# Patient Record
Sex: Female | Born: 1975 | Race: Black or African American | Hispanic: No | Marital: Married | Smoking: Never smoker
Health system: Southern US, Community
[De-identification: ages and names within clinical notes are randomized; demographics above are authoritative.]

## PROBLEM LIST (undated history)

## (undated) DIAGNOSIS — I10 Essential (primary) hypertension: Secondary | ICD-10-CM

## (undated) DIAGNOSIS — D649 Anemia, unspecified: Secondary | ICD-10-CM

## (undated) HISTORY — PX: OTHER SURGICAL HISTORY: SHX169

## (undated) HISTORY — DX: Anemia, unspecified: D64.9

## (undated) HISTORY — DX: Essential (primary) hypertension: I10

---

## 2008-06-24 ENCOUNTER — Ambulatory Visit: Payer: Self-pay | Admitting: Internal Medicine

## 2008-07-21 ENCOUNTER — Inpatient Hospital Stay: Payer: Self-pay | Admitting: Internal Medicine

## 2008-07-25 ENCOUNTER — Ambulatory Visit: Payer: Self-pay | Admitting: Internal Medicine

## 2008-07-28 ENCOUNTER — Ambulatory Visit: Payer: Self-pay | Admitting: Family Medicine

## 2008-08-08 ENCOUNTER — Ambulatory Visit: Payer: Self-pay | Admitting: Internal Medicine

## 2008-08-22 ENCOUNTER — Ambulatory Visit: Payer: Self-pay | Admitting: Internal Medicine

## 2008-09-22 ENCOUNTER — Ambulatory Visit: Payer: Self-pay | Admitting: Internal Medicine

## 2008-10-22 ENCOUNTER — Ambulatory Visit: Payer: Self-pay | Admitting: Internal Medicine

## 2008-11-22 ENCOUNTER — Ambulatory Visit: Payer: Self-pay | Admitting: Oncology

## 2008-12-08 ENCOUNTER — Ambulatory Visit: Payer: Self-pay | Admitting: Oncology

## 2008-12-22 ENCOUNTER — Ambulatory Visit: Payer: Self-pay | Admitting: Oncology

## 2009-01-22 ENCOUNTER — Ambulatory Visit: Payer: Self-pay | Admitting: Oncology

## 2009-02-22 ENCOUNTER — Ambulatory Visit: Payer: Self-pay | Admitting: Oncology

## 2009-03-02 ENCOUNTER — Ambulatory Visit: Payer: Self-pay | Admitting: Oncology

## 2009-03-24 ENCOUNTER — Ambulatory Visit: Payer: Self-pay | Admitting: Oncology

## 2009-05-25 ENCOUNTER — Ambulatory Visit: Payer: Self-pay | Admitting: Oncology

## 2009-06-24 ENCOUNTER — Ambulatory Visit: Payer: Self-pay | Admitting: Oncology

## 2009-07-25 ENCOUNTER — Ambulatory Visit: Payer: Self-pay | Admitting: Oncology

## 2009-08-17 ENCOUNTER — Ambulatory Visit: Payer: Self-pay | Admitting: Oncology

## 2009-08-22 ENCOUNTER — Ambulatory Visit: Payer: Self-pay | Admitting: Oncology

## 2009-09-22 ENCOUNTER — Ambulatory Visit: Payer: Self-pay | Admitting: Oncology

## 2009-11-17 ENCOUNTER — Ambulatory Visit: Payer: Self-pay | Admitting: Oncology

## 2009-11-22 ENCOUNTER — Ambulatory Visit: Payer: Self-pay | Admitting: Oncology

## 2010-02-22 ENCOUNTER — Ambulatory Visit: Payer: Self-pay | Admitting: Oncology

## 2010-02-23 ENCOUNTER — Ambulatory Visit: Payer: Self-pay | Admitting: Oncology

## 2010-03-24 ENCOUNTER — Ambulatory Visit: Payer: Self-pay | Admitting: Oncology

## 2010-05-25 ENCOUNTER — Ambulatory Visit: Payer: Self-pay | Admitting: Oncology

## 2010-06-24 ENCOUNTER — Ambulatory Visit: Payer: Self-pay | Admitting: Oncology

## 2010-08-24 ENCOUNTER — Ambulatory Visit: Payer: Self-pay | Admitting: Oncology

## 2010-09-23 ENCOUNTER — Ambulatory Visit: Payer: Self-pay | Admitting: Oncology

## 2010-12-07 ENCOUNTER — Ambulatory Visit: Payer: Self-pay | Admitting: Oncology

## 2010-12-23 ENCOUNTER — Ambulatory Visit: Payer: Self-pay | Admitting: Oncology

## 2011-03-08 ENCOUNTER — Ambulatory Visit: Payer: Self-pay | Admitting: Oncology

## 2011-03-25 ENCOUNTER — Ambulatory Visit: Payer: Self-pay | Admitting: Oncology

## 2011-06-12 ENCOUNTER — Ambulatory Visit: Payer: Self-pay | Admitting: Oncology

## 2011-06-25 ENCOUNTER — Ambulatory Visit: Payer: Self-pay | Admitting: Oncology

## 2011-11-07 ENCOUNTER — Ambulatory Visit: Payer: Self-pay | Admitting: Oncology

## 2011-11-11 LAB — CBC CANCER CENTER
Basophil #: 0.1 x10 3/mm (ref 0.0–0.1)
Basophil %: 1.2 %
Eosinophil #: 0.3 x10 3/mm (ref 0.0–0.7)
Eosinophil %: 3.6 %
HCT: 36.4 % (ref 35.0–47.0)
HGB: 11.5 g/dL — ABNORMAL LOW (ref 12.0–16.0)
Lymphocyte #: 1.9 x10 3/mm (ref 1.0–3.6)
Lymphocyte %: 25.4 %
MCH: 22.8 pg — ABNORMAL LOW (ref 26.0–34.0)
MCHC: 31.5 g/dL — ABNORMAL LOW (ref 32.0–36.0)
MCV: 72 fL — ABNORMAL LOW (ref 80–100)
Monocyte #: 0.5 x10 3/mm (ref 0.2–0.9)
Monocyte %: 7.1 %
Neutrophil #: 4.8 x10 3/mm (ref 1.4–6.5)
Neutrophil %: 62.7 %
Platelet: 243 x10 3/mm (ref 150–440)
RBC: 5.04 10*6/uL (ref 3.80–5.20)
RDW: 16 % — ABNORMAL HIGH (ref 11.5–14.5)
WBC: 7.6 x10 3/mm (ref 3.6–11.0)

## 2011-11-11 LAB — IRON AND TIBC
Iron Bind.Cap.(Total): 486 ug/dL — ABNORMAL HIGH (ref 250–450)
Iron Saturation: 8 %
Iron: 40 ug/dL — ABNORMAL LOW (ref 50–170)
Unbound Iron-Bind.Cap.: 446 ug/dL

## 2011-11-11 LAB — FERRITIN: Ferritin (ARMC): 9 ng/mL (ref 8–388)

## 2011-11-23 ENCOUNTER — Ambulatory Visit: Payer: Self-pay | Admitting: Oncology

## 2011-12-23 ENCOUNTER — Ambulatory Visit: Payer: Self-pay | Admitting: Oncology

## 2013-08-17 ENCOUNTER — Ambulatory Visit: Payer: Self-pay | Admitting: Oncology

## 2013-08-23 ENCOUNTER — Ambulatory Visit: Payer: Self-pay | Admitting: Hematology and Oncology

## 2013-08-23 LAB — CBC CANCER CENTER
Basophil #: 0.1 x10 3/mm (ref 0.0–0.1)
Basophil %: 1.4 %
Eosinophil #: 0.3 x10 3/mm (ref 0.0–0.7)
Eosinophil %: 3.7 %
HCT: 37.6 % (ref 35.0–47.0)
HGB: 11.6 g/dL — ABNORMAL LOW (ref 12.0–16.0)
Lymphocyte #: 1.7 x10 3/mm (ref 1.0–3.6)
Lymphocyte %: 21.3 %
MCH: 23.2 pg — ABNORMAL LOW (ref 26.0–34.0)
MCHC: 30.9 g/dL — ABNORMAL LOW (ref 32.0–36.0)
MCV: 75 fL — ABNORMAL LOW (ref 80–100)
Monocyte #: 0.6 x10 3/mm (ref 0.2–0.9)
Monocyte %: 7.4 %
Neutrophil #: 5.4 x10 3/mm (ref 1.4–6.5)
Neutrophil %: 66.2 %
Platelet: 288 x10 3/mm (ref 150–440)
RBC: 5 10*6/uL (ref 3.80–5.20)
RDW: 17.8 % — ABNORMAL HIGH (ref 11.5–14.5)
WBC: 8.1 x10 3/mm (ref 3.6–11.0)

## 2013-08-23 LAB — IRON AND TIBC
Iron Bind.Cap.(Total): 432 ug/dL (ref 250–450)
Iron Saturation: 30 %
Iron: 129 ug/dL (ref 50–170)
Unbound Iron-Bind.Cap.: 303 ug/dL

## 2013-08-23 LAB — FERRITIN: Ferritin (ARMC): 25 ng/mL (ref 8–388)

## 2013-09-22 ENCOUNTER — Ambulatory Visit: Payer: Self-pay | Admitting: Hematology and Oncology

## 2014-08-08 ENCOUNTER — Emergency Department: Payer: Self-pay | Admitting: Emergency Medicine

## 2015-02-08 ENCOUNTER — Telehealth: Payer: Self-pay | Admitting: Oncology

## 2015-02-08 NOTE — Telephone Encounter (Signed)
She said she feels like her levels are low and she was told last time she was here (March 2015) to call if she needed to come back. Pt would like to RTC for bloodwork and iron infusion. Please advise.

## 2015-02-08 NOTE — Telephone Encounter (Signed)
Patient may be scheduled for lab/md/feraheme when available.

## 2015-02-15 ENCOUNTER — Other Ambulatory Visit: Payer: Self-pay | Admitting: *Deleted

## 2015-02-15 DIAGNOSIS — D649 Anemia, unspecified: Secondary | ICD-10-CM

## 2015-02-17 ENCOUNTER — Inpatient Hospital Stay: Payer: BLUE CROSS/BLUE SHIELD

## 2015-02-17 ENCOUNTER — Inpatient Hospital Stay (HOSPITAL_BASED_OUTPATIENT_CLINIC_OR_DEPARTMENT_OTHER): Payer: BLUE CROSS/BLUE SHIELD | Admitting: Oncology

## 2015-02-17 ENCOUNTER — Encounter: Payer: Self-pay | Admitting: Oncology

## 2015-02-17 ENCOUNTER — Inpatient Hospital Stay: Payer: BLUE CROSS/BLUE SHIELD | Attending: Oncology

## 2015-02-17 VITALS — BP 133/85 | HR 111 | Temp 96.7°F | Resp 18 | Wt 308.4 lb

## 2015-02-17 DIAGNOSIS — Z79899 Other long term (current) drug therapy: Secondary | ICD-10-CM | POA: Diagnosis not present

## 2015-02-17 DIAGNOSIS — I10 Essential (primary) hypertension: Secondary | ICD-10-CM | POA: Diagnosis not present

## 2015-02-17 DIAGNOSIS — D509 Iron deficiency anemia, unspecified: Secondary | ICD-10-CM | POA: Insufficient documentation

## 2015-02-17 DIAGNOSIS — Z9884 Bariatric surgery status: Secondary | ICD-10-CM | POA: Insufficient documentation

## 2015-02-17 DIAGNOSIS — D649 Anemia, unspecified: Secondary | ICD-10-CM

## 2015-02-17 LAB — CBC WITH DIFFERENTIAL/PLATELET
Basophils Absolute: 0.1 10*3/uL (ref 0–0.1)
Basophils Relative: 1 %
Eosinophils Absolute: 0.4 10*3/uL (ref 0–0.7)
Eosinophils Relative: 5 %
HCT: 37.9 % (ref 35.0–47.0)
Hemoglobin: 11.9 g/dL — ABNORMAL LOW (ref 12.0–16.0)
Lymphocytes Relative: 25 %
Lymphs Abs: 2.1 10*3/uL (ref 1.0–3.6)
MCH: 23.8 pg — ABNORMAL LOW (ref 26.0–34.0)
MCHC: 31.5 g/dL — ABNORMAL LOW (ref 32.0–36.0)
MCV: 75.5 fL — ABNORMAL LOW (ref 80.0–100.0)
Monocytes Absolute: 0.4 10*3/uL (ref 0.2–0.9)
Monocytes Relative: 6 %
Neutro Abs: 5.2 10*3/uL (ref 1.4–6.5)
Neutrophils Relative %: 63 %
Platelets: 264 10*3/uL (ref 150–440)
RBC: 5.02 MIL/uL (ref 3.80–5.20)
RDW: 16.9 % — ABNORMAL HIGH (ref 11.5–14.5)
WBC: 8.1 10*3/uL (ref 3.6–11.0)

## 2015-02-17 LAB — IRON AND TIBC
Iron: 55 ug/dL (ref 28–170)
Saturation Ratios: 13 % (ref 10.4–31.8)
TIBC: 420 ug/dL (ref 250–450)
UIBC: 365 ug/dL

## 2015-02-17 LAB — FERRITIN: Ferritin: 33 ng/mL (ref 11–307)

## 2015-02-17 NOTE — Progress Notes (Signed)
[facility]  Telephone:(336) [phone] Fax:(336) [phone]  ID: Jeanette Flores OB: 01-18-1976  MR#: [id number]  HYQ#:[id number]  No care team member to display  CHIEF COMPLAINT:  Chief Complaint  Patient presents with  . Follow-up    anemia    INTERVAL HISTORY: Patient last seen in clinic in March of 2015.  Patient states she had noticed some increased weakness and fatigue over the past several weeks and wanted to be sure her iron stores and hemoglobin were not decreased.  She continues to tolerate oral iron without significant side effects.  She continues to be active and work full-time.  She denies any neurologic symptoms. She denies any fever, chills, or night sweats.  She denies any chest pain, shortness of breath, or cough.  She continues to have heavy menses.  Patient offers no further specific complaints today.   REVIEW OF SYSTEMS:   Review of Systems  Constitutional: Negative for malaise/fatigue.  Respiratory: Negative.   Cardiovascular: Negative.   Gastrointestinal: Negative.   Neurological: Positive for weakness.    As per HPI. Otherwise, a complete review of systems is negatve.  PAST MEDICAL HISTORY: Past Medical History  Diagnosis Date  . Hypertension     PAST SURGICAL HISTORY: Past Surgical History  Procedure Laterality Date  . Gastric bypass surgery     FAMILY HISTORY: Colon and breast cancer. No history of clotting or bleeding disorder.      ADVANCED DIRECTIVES:    HEALTH MAINTENANCE: Social History  Substance Use Topics  . Smoking status: Not on file  . Smokeless tobacco: Not on file  . Alcohol Use: Not on file     Colonoscopy:  PAP:  Bone density:  Lipid panel:  No Known Allergies  Current Outpatient Prescriptions  Medication Sig Dispense Refill  . ferrous sulfate 325 (65 FE) MG tablet Take 325 mg by mouth daily with breakfast.    . losartan-hydrochlorothiazide (HYZAAR) 100-12.5 MG per tablet   4   No current  facility-administered medications for this visit.    OBJECTIVE: Filed Vitals:   02/17/15 1143  BP: 133/85  Pulse: 111  Temp: 96.7 F (35.9 C)  Resp: 18     There is no height on file to calculate BMI.    ECOG FS:0 - Asymptomatic  General: Well-developed, well-nourished, no acute distress. Eyes: Pink conjunctiva, anicteric sclera. Lungs: Clear to auscultation bilaterally. Heart: Regular rate and rhythm. No rubs, murmurs, or gallops. Abdomen: Soft, nontender, nondistended. No organomegaly noted, normoactive bowel sounds. Musculoskeletal: No edema, cyanosis, or clubbing. Neuro: Alert, answering all questions appropriately. Cranial nerves grossly intact. Skin: No rashes or petechiae noted. Psych: Normal affect.  LAB RESULTS:  No results found for: NA, K, CL, CO2, GLUCOSE, BUN, CREATININE, CALCIUM, PROT, ALBUMIN, AST, ALT, ALKPHOS, BILITOT, GFRNONAA, GFRAA  Lab Results  Component Value Date   WBC 8.1 02/17/2015   NEUTROABS 5.2 02/17/2015   HGB 11.9* 02/17/2015   HCT 37.9 02/17/2015   MCV 75.5* 02/17/2015   PLT 264 02/17/2015     STUDIES: No results found.  ASSESSMENT: Iron deficiency anemia.  PLAN:    1.  Iron deficiency anemia: Patient's hemoglobin is essentially within normal limits, iron stores are pending from today. Patient does not require IV iron today.  She last received IV iron in May of 2013.  Continue oral iron supplementation.  No followup has been scheduled, but patient has been instructed if she begins to feel increasing weakness and fatigue, she can return to clinic for further  evaluation and to recheck her laboratory work.    Patient expressed understanding and was in agreement with this plan. She also understands that She can call clinic at any time with any questions, concerns, or complaints.     Jeralyn Ruths, MD   02/17/2015 4:42 PM

## 2015-08-24 ENCOUNTER — Encounter (INDEPENDENT_AMBULATORY_CARE_PROVIDER_SITE_OTHER): Payer: BLUE CROSS/BLUE SHIELD | Admitting: Ophthalmology

## 2015-08-24 DIAGNOSIS — H43813 Vitreous degeneration, bilateral: Secondary | ICD-10-CM | POA: Diagnosis not present

## 2015-08-24 DIAGNOSIS — H2511 Age-related nuclear cataract, right eye: Secondary | ICD-10-CM | POA: Diagnosis not present

## 2015-08-24 DIAGNOSIS — I10 Essential (primary) hypertension: Secondary | ICD-10-CM

## 2015-08-24 DIAGNOSIS — H35033 Hypertensive retinopathy, bilateral: Secondary | ICD-10-CM | POA: Diagnosis not present

## 2015-08-24 DIAGNOSIS — H3540 Unspecified peripheral retinal degeneration: Secondary | ICD-10-CM

## 2015-09-21 ENCOUNTER — Encounter: Payer: Self-pay | Admitting: Family Medicine

## 2015-09-21 ENCOUNTER — Ambulatory Visit (INDEPENDENT_AMBULATORY_CARE_PROVIDER_SITE_OTHER): Payer: BLUE CROSS/BLUE SHIELD | Admitting: Family Medicine

## 2015-09-21 VITALS — BP 122/84 | HR 80 | Ht 66.0 in | Wt 309.0 lb

## 2015-09-21 DIAGNOSIS — D509 Iron deficiency anemia, unspecified: Secondary | ICD-10-CM

## 2015-09-21 DIAGNOSIS — E119 Type 2 diabetes mellitus without complications: Secondary | ICD-10-CM | POA: Diagnosis not present

## 2015-09-21 MED ORDER — METFORMIN HCL ER 500 MG PO TB24: 500.0000 mg | ORAL_TABLET | Freq: Every day | ORAL | Status: DC

## 2015-09-21 MED ORDER — GLIPIZIDE 5 MG PO TABS: 5.0000 mg | ORAL_TABLET | Freq: Two times a day (BID) | ORAL | Status: DC

## 2015-09-21 NOTE — Progress Notes (Signed)
Name: Jeanette Flores   MRN: [id number]    DOB: 1975/08/14   Date:09/21/2015       Progress Note  Subjective  Chief Complaint  Chief Complaint  Patient presents with  . Diabetes    "fasting Blood sugar yesterday was 244"    Diabetes She presents for her follow-up diabetic visit. She has type 2 diabetes mellitus. Her disease course has been stable. There are no hypoglycemic associated symptoms. Pertinent negatives for hypoglycemia include no confusion, dizziness, headaches, hunger, mood changes, nervousness/anxiousness, pallor, seizures, sleepiness, speech difficulty, sweats or tremors. Associated symptoms include fatigue. Pertinent negatives for diabetes include no blurred vision, no chest pain, no foot paresthesias, no foot ulcerations, no polydipsia, no polyphagia, no polyuria, no visual change, no weakness and no weight loss. There are no hypoglycemic complications. Symptoms are stable. There are no diabetic complications. There are no known risk factors for coronary artery disease. When asked about current treatments, none were reported. She is compliant with treatment most of the time. Her weight is increasing steadily. She is following a generally healthy diet. She has not had a previous visit with a dietitian. Her breakfast blood glucose is taken between 8-9 am. Her breakfast blood glucose range is generally 180-200 mg/dl. An ACE inhibitor/angiotensin II receptor blocker is being taken. She does not see a podiatrist.Eye exam is not current.  Anemia Presents for follow-up visit. There has been no abdominal pain, anorexia, bruising/bleeding easily, confusion, fever, leg swelling, light-headedness, malaise/fatigue, pallor, palpitations, paresthesias or weight loss. Signs of blood loss that are not present include hematemesis, hematochezia, melena, menorrhagia and vaginal bleeding. Past treatments include oral iron supplements. There are no compliance problems.     No problem-specific assessment &  plan notes found for this encounter.   Past Medical History  Diagnosis Date  . Hypertension   . Anemia     Past Surgical History  Procedure Laterality Date  . Gastric bypass surgery      Family History  Problem Relation Age of Onset  . Diabetes Father   . Diabetes Paternal Grandmother     Social History   Social History  . Marital Status: Single    Spouse Name: N/A  . Number of Children: N/A  . Years of Education: N/A   Occupational History  . Not on file.   Social History Main Topics  . Smoking status: Never Smoker   . Smokeless tobacco: Not on file  . Alcohol Use: No  . Drug Use: No  . Sexual Activity: Not on file   Other Topics Concern  . Not on file   Social History Narrative    No Known Allergies   Review of Systems  Constitutional: Positive for fatigue. Negative for fever, chills, weight loss and malaise/fatigue.  HENT: Negative for ear discharge, ear pain and sore throat.   Eyes: Negative for blurred vision.  Respiratory: Negative for cough, sputum production, shortness of breath and wheezing.   Cardiovascular: Negative for chest pain, palpitations and leg swelling.  Gastrointestinal: Negative for heartburn, nausea, abdominal pain, diarrhea, constipation, blood in stool, melena, hematochezia, anorexia and hematemesis.  Genitourinary: Negative for dysuria, urgency, frequency, hematuria, vaginal bleeding and menorrhagia.  Musculoskeletal: Negative for myalgias, back pain, joint pain and neck pain.  Skin: Negative for pallor and rash.  Neurological: Negative for dizziness, tingling, tremors, sensory change, focal weakness, seizures, speech difficulty, weakness, light-headedness, headaches and paresthesias.  Endo/Heme/Allergies: Negative for environmental allergies, polydipsia and polyphagia. Does not bruise/bleed easily.  Psychiatric/Behavioral: Negative for depression,  suicidal ideas and confusion. The patient is not nervous/anxious and does not have  insomnia.      Objective  Filed Vitals:   09/21/15 0807  BP: 122/84  Pulse: 80  Height:  (1.676 m)  Weight: 309 lb (140.161 kg)    Physical Exam  Constitutional: She is well-developed, well-nourished, and in no distress. No distress.  HENT:  Head: Normocephalic and atraumatic.  Right Ear: External ear normal.  Left Ear: External ear normal.  Nose: Nose normal.  Mouth/Throat: Oropharynx is clear and moist.  Eyes: Conjunctivae and EOM are normal. Pupils are equal, round, and reactive to light. Right eye exhibits no discharge. Left eye exhibits no discharge.  Neck: Normal range of motion. Neck supple. No JVD present. No thyromegaly present.  Cardiovascular: Normal rate, regular rhythm and intact distal pulses.  Exam reveals no gallop and no friction rub.   Murmur heard. Pulmonary/Chest: Effort normal and breath sounds normal.  Abdominal: Soft. Bowel sounds are normal. She exhibits no mass. There is no tenderness. There is no guarding.  Musculoskeletal: Normal range of motion. She exhibits no edema.  Lymphadenopathy:    She has no cervical adenopathy.  Neurological: She is alert.  Skin: Skin is warm and dry. She is not diaphoretic.  Psychiatric: Mood and affect normal.  Nursing note and vitals reviewed.     Assessment & Plan  Problem List Items Addressed This Visit    None    Visit Diagnoses    Type 2 diabetes mellitus without complication, without long-term current use of insulin (HCC)    -  Primary    Relevant Medications    metFORMIN (GLUCOPHAGE XR) 500 MG 24 hr tablet    glipiZIDE (GLUCOTROL) 5 MG tablet    Other Relevant Orders    Hemoglobin A1c    Renal Function Panel    Iron deficiency anemia        Relevant Orders    CBC         Dr. [facility]  09/21/2015

## 2015-09-22 LAB — CBC
Hematocrit: 39 % (ref 34.0–46.6)
Hemoglobin: 12.4 g/dL (ref 11.1–15.9)
MCH: 24.8 pg — ABNORMAL LOW (ref 26.6–33.0)
MCHC: 31.8 g/dL (ref 31.5–35.7)
MCV: 78 fL — ABNORMAL LOW (ref 79–97)
Platelets: 305 10*3/uL (ref 150–379)
RBC: 5 x10E6/uL (ref 3.77–5.28)
RDW: 15.2 % (ref 12.3–15.4)
WBC: 8.3 10*3/uL (ref 3.4–10.8)

## 2015-09-22 LAB — RENAL FUNCTION PANEL
Albumin: 3.9 g/dL (ref 3.5–5.5)
BUN/Creatinine Ratio: 17 (ref 8–20)
BUN: 11 mg/dL (ref 6–20)
CO2: 20 mmol/L (ref 18–29)
Calcium: 9.1 mg/dL (ref 8.7–10.2)
Chloride: 93 mmol/L — ABNORMAL LOW (ref 96–106)
Creatinine, Ser: 0.63 mg/dL (ref 0.57–1.00)
GFR calc Af Amer: 133 mL/min/{1.73_m2} (ref >59)
GFR calc non Af Amer: 115 mL/min/{1.73_m2} (ref >59)
Glucose: 270 mg/dL — ABNORMAL HIGH (ref 65–99)
Phosphorus: 3.3 mg/dL (ref 2.5–4.5)
Potassium: 4.9 mmol/L (ref 3.5–5.2)
Sodium: 134 mmol/L (ref 134–144)

## 2015-09-22 LAB — HEMOGLOBIN A1C
Est. average glucose Bld gHb Est-mCnc: 252 mg/dL
Hgb A1c MFr Bld: 10.4 % — ABNORMAL HIGH (ref 4.8–5.6)

## 2015-09-28 ENCOUNTER — Other Ambulatory Visit: Payer: Self-pay

## 2015-10-06 ENCOUNTER — Other Ambulatory Visit: Payer: Self-pay | Admitting: Family Medicine

## 2015-11-02 ENCOUNTER — Ambulatory Visit (INDEPENDENT_AMBULATORY_CARE_PROVIDER_SITE_OTHER): Payer: BLUE CROSS/BLUE SHIELD | Admitting: Family Medicine

## 2015-11-02 ENCOUNTER — Encounter: Payer: Self-pay | Admitting: Family Medicine

## 2015-11-02 VITALS — BP 120/80 | HR 76 | Ht 66.0 in | Wt 307.0 lb

## 2015-11-02 DIAGNOSIS — I10 Essential (primary) hypertension: Secondary | ICD-10-CM

## 2015-11-02 DIAGNOSIS — E663 Overweight: Secondary | ICD-10-CM

## 2015-11-02 DIAGNOSIS — E785 Hyperlipidemia, unspecified: Secondary | ICD-10-CM | POA: Diagnosis not present

## 2015-11-02 DIAGNOSIS — E119 Type 2 diabetes mellitus without complications: Secondary | ICD-10-CM

## 2015-11-02 MED ORDER — GLIPIZIDE 5 MG PO TABS: 5.0000 mg | ORAL_TABLET | Freq: Two times a day (BID) | ORAL | Status: DC

## 2015-11-02 MED ORDER — METFORMIN HCL ER 500 MG PO TB24
500.0000 mg | ORAL_TABLET | Freq: Every day | ORAL | Status: DC
Start: 2015-11-02 — End: 2016-04-18

## 2015-11-02 MED ORDER — LOSARTAN POTASSIUM-HCTZ 100-12.5 MG PO TABS: 1.0000 | ORAL_TABLET | Freq: Every day | ORAL | Status: DC

## 2015-11-02 NOTE — Progress Notes (Signed)
Name: Jeanette Flores   MRN: [id number]    DOB: 15-Feb-1976   Date:11/02/2015       Progress Note  Subjective  Chief Complaint  Chief Complaint  Patient presents with  . Diabetes    Diabetes She presents for her follow-up diabetic visit. Her disease course has been stable. There are no hypoglycemic associated symptoms. Pertinent negatives for hypoglycemia include no dizziness, headaches, nervousness/anxiousness or sweats. Pertinent negatives for diabetes include no blurred vision, no chest pain, no fatigue, no foot paresthesias, no foot ulcerations, no polydipsia, no polyphagia, no polyuria, no visual change, no weakness and no weight loss. There are no hypoglycemic complications. Symptoms are stable. There are no diabetic complications. Pertinent negatives for diabetic complications include no CVA, PVD or retinopathy. Current diabetic treatment includes diet and oral agent (dual therapy). She is compliant with treatment all of the time. Her weight is stable. She is following a generally healthy diet. She has not had a previous visit with a dietitian. She participates in exercise every other day. Her breakfast blood glucose is taken between 8-9 am. Her breakfast blood glucose range is generally 130-140 mg/dl. An ACE inhibitor/angiotensin II receptor blocker is being taken. She does not see a podiatrist.Eye exam is current.  Hypertension This is a chronic problem. The current episode started more than 1 year ago. The problem has been gradually improving since onset. The problem is controlled. Pertinent negatives include no anxiety, blurred vision, chest pain, headaches, malaise/fatigue, neck pain, orthopnea, palpitations, peripheral edema, PND, shortness of breath or sweats. There are no associated agents to hypertension. Risk factors for coronary artery disease include diabetes mellitus, dyslipidemia and obesity. Past treatments include angiotensin blockers and diuretics. The current treatment provides no  improvement. There are no compliance problems.  There is no history of angina, kidney disease, CAD/MI, CVA, heart failure, left ventricular hypertrophy, PVD, renovascular disease or retinopathy. There is no history of chronic renal disease or a hypertension causing med.    No problem-specific assessment & plan notes found for this encounter.   Past Medical History  Diagnosis Date  . Hypertension   . Anemia     Past Surgical History  Procedure Laterality Date  . Gastric bypass surgery      Family History  Problem Relation Age of Onset  . Diabetes Father   . Diabetes Paternal Grandmother     Social History   Social History  . Marital Status: Single    Spouse Name: N/A  . Number of Children: N/A  . Years of Education: N/A   Occupational History  . Not on file.   Social History Main Topics  . Smoking status: Never Smoker   . Smokeless tobacco: Not on file  . Alcohol Use: No  . Drug Use: No  . Sexual Activity: Not on file   Other Topics Concern  . Not on file   Social History Narrative    No Known Allergies   Review of Systems  Constitutional: Negative for fever, chills, weight loss, malaise/fatigue and fatigue.  HENT: Negative for ear discharge, ear pain and sore throat.   Eyes: Negative for blurred vision.  Respiratory: Negative for cough, sputum production, shortness of breath and wheezing.   Cardiovascular: Negative for chest pain, palpitations, orthopnea, leg swelling and PND.  Gastrointestinal: Negative for heartburn, nausea, abdominal pain, diarrhea, constipation, blood in stool and melena.  Genitourinary: Negative for dysuria, urgency, frequency and hematuria.  Musculoskeletal: Negative for myalgias, back pain, joint pain and neck pain.  Skin:  Negative for rash.  Neurological: Negative for dizziness, tingling, sensory change, focal weakness, weakness and headaches.  Endo/Heme/Allergies: Negative for environmental allergies, polydipsia and polyphagia.  Does not bruise/bleed easily.  Psychiatric/Behavioral: Negative for depression and suicidal ideas. The patient is not nervous/anxious and does not have insomnia.      Objective  Filed Vitals:   11/02/15 0810  BP: 120/80  Pulse: 76  Height:  (1.676 m)  Weight: 307 lb (139.254 kg)    Physical Exam  Constitutional: She is well-developed, well-nourished, and in no distress. No distress.  HENT:  Head: Normocephalic and atraumatic.  Right Ear: Tympanic membrane, external ear and ear canal normal. No decreased hearing is noted.  Left Ear: Tympanic membrane, external ear and ear canal normal. No decreased hearing is noted.  Nose: Nose normal.  Mouth/Throat: Oropharynx is clear and moist.  Eyes: Conjunctivae and EOM are normal. Pupils are equal, round, and reactive to light. Right eye exhibits no discharge. Left eye exhibits no discharge.  Neck: Normal range of motion. Neck supple. No JVD present. No thyromegaly present.  Cardiovascular: Normal rate, regular rhythm, normal heart sounds and intact distal pulses.  Exam reveals no gallop and no friction rub.   No murmur heard. Pulmonary/Chest: Effort normal and breath sounds normal.  Abdominal: Soft. Bowel sounds are normal. She exhibits no mass. There is no tenderness. There is no guarding.  Musculoskeletal: Normal range of motion. She exhibits no edema.  Lymphadenopathy:    She has no cervical adenopathy.  Neurological: She is alert. She has normal reflexes.  monofilament  Skin: Skin is warm and dry. She is not diaphoretic.  Psychiatric: Mood and affect normal.  Nursing note and vitals reviewed.     Assessment & Plan  Problem List Items Addressed This Visit      Cardiovascular and Mediastinum   Essential hypertension - Primary   Relevant Medications   aspirin 81 MG tablet   losartan-hydrochlorothiazide (HYZAAR) 100-12.5 MG tablet   Other Relevant Orders   Renal Function Panel     Endocrine   Type 2 diabetes mellitus  without complication, without long-term current use of insulin (HCC)   Relevant Medications   aspirin 81 MG tablet   glipiZIDE (GLUCOTROL) 5 MG tablet   metFORMIN (GLUCOPHAGE XR) 500 MG 24 hr tablet   losartan-hydrochlorothiazide (HYZAAR) 100-12.5 MG tablet   Other Relevant Orders   Hemoglobin A1c   Microalbumin / creatinine urine ratio     Other   Hyperlipidemia   Relevant Medications   aspirin 81 MG tablet   losartan-hydrochlorothiazide (HYZAAR) 100-12.5 MG tablet   Other Relevant Orders   Lipid Profile   Overweight        Dr. [facility]  11/02/2015

## 2015-11-03 LAB — RENAL FUNCTION PANEL
Albumin: 4.2 g/dL (ref 3.5–5.5)
BUN/Creatinine Ratio: 20 (ref 9–23)
BUN: 15 mg/dL (ref 6–20)
CO2: 21 mmol/L (ref 18–29)
Calcium: 9.4 mg/dL (ref 8.7–10.2)
Chloride: 95 mmol/L — ABNORMAL LOW (ref 96–106)
Creatinine, Ser: 0.74 mg/dL (ref 0.57–1.00)
GFR calc Af Amer: 120 mL/min/{1.73_m2} (ref >59)
GFR calc non Af Amer: 104 mL/min/{1.73_m2} (ref >59)
Glucose: 126 mg/dL — ABNORMAL HIGH (ref 65–99)
Phosphorus: 4.5 mg/dL (ref 2.5–4.5)
Potassium: 4.8 mmol/L (ref 3.5–5.2)
Sodium: 137 mmol/L (ref 134–144)

## 2015-11-03 LAB — MICROALBUMIN / CREATININE URINE RATIO
Creatinine, Urine: 254.2 mg/dL
MICROALB/CREAT RATIO: 14.9 mg/g creat (ref 0.0–30.0)
Microalbumin, Urine: 37.9 ug/mL

## 2015-11-03 LAB — LIPID PANEL
Chol/HDL Ratio: 5.4 ratio units — ABNORMAL HIGH (ref 0.0–4.4)
Cholesterol, Total: 201 mg/dL — ABNORMAL HIGH (ref 100–199)
HDL: 37 mg/dL — ABNORMAL LOW (ref >39)
LDL Calculated: 147 mg/dL — ABNORMAL HIGH (ref 0–99)
Triglycerides: 87 mg/dL (ref 0–149)
VLDL Cholesterol Cal: 17 mg/dL (ref 5–40)

## 2015-11-03 LAB — HEMOGLOBIN A1C
Est. average glucose Bld gHb Est-mCnc: 206 mg/dL
Hgb A1c MFr Bld: 8.8 % — ABNORMAL HIGH (ref 4.8–5.6)

## 2015-11-03 MED ORDER — GLIPIZIDE 10 MG PO TABS: 10.0000 mg | ORAL_TABLET | Freq: Two times a day (BID) | ORAL | Status: DC

## 2015-11-03 NOTE — Addendum Note (Signed)
Addended by: Everitt AmberLYNCH, Koula Venier L on: 11/03/2015 04:32 PM   Modules accepted: Orders, Medications

## 2015-11-07 ENCOUNTER — Telehealth: Payer: Self-pay | Admitting: Oncology

## 2015-11-07 NOTE — Telephone Encounter (Signed)
Patient lvm in [place]. She is feeling really weak and tired and requests lab/md visit. Please advise. Thanks.

## 2015-11-07 NOTE — Telephone Encounter (Signed)
Pt last seen in august 2016.  She can have next available appt. Add lab encounter and feraheme to visit.

## 2015-11-08 ENCOUNTER — Other Ambulatory Visit: Payer: Self-pay | Admitting: Oncology

## 2015-11-08 DIAGNOSIS — D509 Iron deficiency anemia, unspecified: Secondary | ICD-10-CM

## 2015-11-08 DIAGNOSIS — E119 Type 2 diabetes mellitus without complications: Secondary | ICD-10-CM

## 2015-11-09 ENCOUNTER — Other Ambulatory Visit: Payer: Self-pay | Admitting: *Deleted

## 2015-11-09 DIAGNOSIS — D509 Iron deficiency anemia, unspecified: Secondary | ICD-10-CM

## 2015-11-10 ENCOUNTER — Ambulatory Visit (HOSPITAL_BASED_OUTPATIENT_CLINIC_OR_DEPARTMENT_OTHER): Payer: BLUE CROSS/BLUE SHIELD | Admitting: Oncology

## 2015-11-10 ENCOUNTER — Inpatient Hospital Stay: Payer: BLUE CROSS/BLUE SHIELD

## 2015-11-10 ENCOUNTER — Inpatient Hospital Stay: Payer: BLUE CROSS/BLUE SHIELD | Attending: Oncology

## 2015-11-10 VITALS — BP 101/70 | HR 87 | Temp 96.7°F | Resp 18

## 2015-11-10 VITALS — BP 133/84 | HR 105 | Temp 96.1°F

## 2015-11-10 DIAGNOSIS — E1165 Type 2 diabetes mellitus with hyperglycemia: Secondary | ICD-10-CM | POA: Insufficient documentation

## 2015-11-10 DIAGNOSIS — R5383 Other fatigue: Secondary | ICD-10-CM | POA: Diagnosis not present

## 2015-11-10 DIAGNOSIS — Z79899 Other long term (current) drug therapy: Secondary | ICD-10-CM | POA: Diagnosis not present

## 2015-11-10 DIAGNOSIS — Z9884 Bariatric surgery status: Secondary | ICD-10-CM | POA: Insufficient documentation

## 2015-11-10 DIAGNOSIS — I10 Essential (primary) hypertension: Secondary | ICD-10-CM | POA: Diagnosis not present

## 2015-11-10 DIAGNOSIS — R531 Weakness: Secondary | ICD-10-CM | POA: Insufficient documentation

## 2015-11-10 DIAGNOSIS — Z7984 Long term (current) use of oral hypoglycemic drugs: Secondary | ICD-10-CM | POA: Insufficient documentation

## 2015-11-10 DIAGNOSIS — D509 Iron deficiency anemia, unspecified: Secondary | ICD-10-CM

## 2015-11-10 DIAGNOSIS — Z7982 Long term (current) use of aspirin: Secondary | ICD-10-CM

## 2015-11-10 LAB — CBC WITH DIFFERENTIAL/PLATELET
Basophils Absolute: 0.1 10*3/uL (ref 0–0.1)
Basophils Relative: 1 %
Eosinophils Absolute: 0.4 10*3/uL (ref 0–0.7)
Eosinophils Relative: 4 %
HCT: 38.1 % (ref 35.0–47.0)
Hemoglobin: 11.9 g/dL — ABNORMAL LOW (ref 12.0–16.0)
Lymphocytes Relative: 24 %
Lymphs Abs: 2.4 10*3/uL (ref 1.0–3.6)
MCH: 23.9 pg — ABNORMAL LOW (ref 26.0–34.0)
MCHC: 31.4 g/dL — ABNORMAL LOW (ref 32.0–36.0)
MCV: 76.1 fL — ABNORMAL LOW (ref 80.0–100.0)
Monocytes Absolute: 0.3 10*3/uL (ref 0.2–0.9)
Monocytes Relative: 3 %
Neutro Abs: 6.9 10*3/uL — ABNORMAL HIGH (ref 1.4–6.5)
Neutrophils Relative %: 68 %
Platelets: 307 10*3/uL (ref 150–440)
RBC: 5 MIL/uL (ref 3.80–5.20)
RDW: 14.9 % — ABNORMAL HIGH (ref 11.5–14.5)
WBC: 10.1 10*3/uL (ref 3.6–11.0)

## 2015-11-10 LAB — IRON AND TIBC
Iron: 33 ug/dL (ref 28–170)
Saturation Ratios: 8 % — ABNORMAL LOW (ref 10.4–31.8)
TIBC: 420 ug/dL (ref 250–450)
UIBC: 387 ug/dL

## 2015-11-10 LAB — FERRITIN: Ferritin: 36 ng/mL (ref 11–307)

## 2015-11-10 MED ORDER — SODIUM CHLORIDE 0.9 % IV SOLN
510.0000 mg | Freq: Once | INTRAVENOUS | Status: AC
  Administered 2015-11-10: 510 mg via INTRAVENOUS
  Filled 2015-11-10: qty 17

## 2015-11-10 MED ORDER — SODIUM CHLORIDE 0.9 % IV SOLN
Freq: Once | INTRAVENOUS | Status: AC
  Administered 2015-11-10: 15:00:00 via INTRAVENOUS
  Filled 2015-11-10: qty 1000

## 2015-11-10 NOTE — Patient Instructions (Signed)

## 2015-11-10 NOTE — Progress Notes (Signed)
Patient has noticed an increase in fatigue in the last 2 weeks.

## 2015-11-15 NOTE — Progress Notes (Signed)
[facility]  Telephone:(336) [phone] Fax:(336) [phone]  ID: Jeanette Flores OB: 29-Sep-1975  MR#: [id number]  PPI#:[id number]#:[id number]  Patient Care Team: Duanne Limerickeanna C Jones, MD as PCP - General (Family Medicine)  CHIEF COMPLAINT:  Chief Complaint  Patient presents with  . Anemia    INTERVAL HISTORY: Patient last seen in clinic in August 2016. Patient states she has noticed increased weakness and fatigue over the past several weeks. She is no longer taking oral iron supplementation. She continues to be active and work full-time.  She denies any neurologic symptoms. She denies any fever, chills, or night sweats.  She denies any chest pain, shortness of breath, or cough.  She continues to have heavy menses.  Patient offers no further specific complaints today.   REVIEW OF SYSTEMS:   Review of Systems  Constitutional: Negative for fever, weight loss and malaise/fatigue.  Respiratory: Negative.  Negative for shortness of breath.   Cardiovascular: Negative.  Negative for chest pain.  Gastrointestinal: Negative.  Negative for blood in stool and melena.  Musculoskeletal: Negative.   Neurological: Negative.   Psychiatric/Behavioral: Negative.     As per HPI. Otherwise, a complete review of systems is negatve.  PAST MEDICAL HISTORY: Past Medical History  Diagnosis Date  . Hypertension   . Anemia     PAST SURGICAL HISTORY: Past Surgical History  Procedure Laterality Date  . Gastric bypass surgery     FAMILY HISTORY: Colon and breast cancer. No history of clotting or bleeding disorder.      ADVANCED DIRECTIVES:    HEALTH MAINTENANCE: Social History  Substance Use Topics  . Smoking status: Never Smoker   . Smokeless tobacco: Not on file  . Alcohol Use: No     Colonoscopy:  PAP:  Bone density:  Lipid panel:  No Known Allergies  Current Outpatient Prescriptions  Medication Sig Dispense Refill  . aspirin 81 MG tablet Take 81 mg by mouth daily.    Marland Kitchen. glipiZIDE  (GLUCOTROL) 10 MG tablet Take 1 tablet (10 mg total) by mouth 2 (two) times daily before a meal. 60 tablet 1  . losartan-hydrochlorothiazide (HYZAAR) 100-12.5 MG tablet Take 1 tablet by mouth daily. 30 tablet 6  . metFORMIN (GLUCOPHAGE XR) 500 MG 24 hr tablet Take 1 tablet (500 mg total) by mouth daily with breakfast. 30 tablet 6   No current facility-administered medications for this visit.    OBJECTIVE: Filed Vitals:   11/10/15 1412  BP: 133/84  Pulse: 105  Temp: 96.1 F (35.6 C)     There is no weight on file to calculate BMI.    ECOG FS:0 - Asymptomatic  General: Well-developed, well-nourished, no acute distress. Eyes: Pink conjunctiva, anicteric sclera. Lungs: Clear to auscultation bilaterally. Heart: Regular rate and rhythm. No rubs, murmurs, or gallops. Abdomen: Soft, nontender, nondistended. No organomegaly noted, normoactive bowel sounds. Musculoskeletal: No edema, cyanosis, or clubbing. Neuro: Alert, answering all questions appropriately. Cranial nerves grossly intact. Skin: No rashes or petechiae noted. Psych: Normal affect.  LAB RESULTS:  Lab Results  Component Value Date   NA 137 11/02/2015   K 4.8 11/02/2015   CL 95* 11/02/2015   CO2 21 11/02/2015   GLUCOSE 126* 11/02/2015   BUN 15 11/02/2015   CREATININE 0.74 11/02/2015   CALCIUM 9.4 11/02/2015   ALBUMIN 4.2 11/02/2015   GFRNONAA 104 11/02/2015   GFRAA 120 11/02/2015    Lab Results  Component Value Date   WBC 10.1 11/10/2015   NEUTROABS 6.9* 11/10/2015   HGB  11.9* 11/10/2015   HCT 38.1 11/10/2015   MCV 76.1* 11/10/2015   PLT 307 11/10/2015   Lab Results  Component Value Date   IRON 33 11/10/2015   TIBC 420 11/10/2015   IRONPCTSAT 8* 11/10/2015    Lab Results  Component Value Date   FERRITIN 36 11/10/2015     STUDIES: No results found.  ASSESSMENT: Iron deficiency anemia.  PLAN:    1.  Iron deficiency anemia: Patient's hemoglobin is essentially within normal limits, But her iron  stores are decreased and she is mildly symptomatic. Proceed with 510 mg IV Feraheme today. Have recommended patient restart oral iron supplementation as well. Return to clinic in 6 months with repeat laboratory work and further evaluation.  2. Hyperglycemia: Continue current diabetic medications as prescribed.  Patient expressed understanding and was in agreement with this plan. She also understands that She can call clinic at any time with any questions, concerns, or complaints.     Jeralyn Ruths, MD   11/15/2015 9:09 AM

## 2015-12-09 ENCOUNTER — Other Ambulatory Visit: Payer: Self-pay | Admitting: Nurse Practitioner

## 2015-12-25 ENCOUNTER — Other Ambulatory Visit: Payer: Self-pay | Admitting: Nurse Practitioner

## 2016-01-18 ENCOUNTER — Other Ambulatory Visit: Payer: Self-pay | Admitting: Family Medicine

## 2016-04-17 ENCOUNTER — Ambulatory Visit: Payer: BLUE CROSS/BLUE SHIELD | Admitting: Family Medicine

## 2016-04-18 ENCOUNTER — Encounter: Payer: Self-pay | Admitting: Family Medicine

## 2016-04-18 ENCOUNTER — Ambulatory Visit (INDEPENDENT_AMBULATORY_CARE_PROVIDER_SITE_OTHER): Payer: BLUE CROSS/BLUE SHIELD | Admitting: Family Medicine

## 2016-04-18 VITALS — BP 110/70 | HR 80 | Ht 66.0 in | Wt 309.0 lb

## 2016-04-18 DIAGNOSIS — E782 Mixed hyperlipidemia: Secondary | ICD-10-CM

## 2016-04-18 DIAGNOSIS — I10 Essential (primary) hypertension: Secondary | ICD-10-CM | POA: Diagnosis not present

## 2016-04-18 DIAGNOSIS — E119 Type 2 diabetes mellitus without complications: Secondary | ICD-10-CM

## 2016-04-18 DIAGNOSIS — Z23 Encounter for immunization: Secondary | ICD-10-CM | POA: Diagnosis not present

## 2016-04-18 MED ORDER — GLIPIZIDE 10 MG PO TABS: ORAL_TABLET | ORAL | 1 refills | Status: DC

## 2016-04-18 MED ORDER — LOSARTAN POTASSIUM-HCTZ 100-12.5 MG PO TABS: 1.0000 | ORAL_TABLET | Freq: Every day | ORAL | 1 refills | Status: DC

## 2016-04-18 MED ORDER — ASPIRIN 81 MG PO TABS: 81.0000 mg | ORAL_TABLET | Freq: Every day | ORAL | 11 refills | Status: AC

## 2016-04-18 MED ORDER — METFORMIN HCL ER 500 MG PO TB24: 500.0000 mg | ORAL_TABLET | Freq: Every day | ORAL | 1 refills | Status: AC

## 2016-04-18 NOTE — Progress Notes (Signed)
Name: Jeanette Flores   MRN: [id number]    DOB: 1976/06/08   Date:04/18/2016       Progress Note  Subjective  Chief Complaint  Chief Complaint  Patient presents with  . Hypertension  . Diabetes    Hypertension  This is a chronic problem. The current episode started more than 1 year ago. The problem has been gradually improving since onset. The problem is controlled. Pertinent negatives include no anxiety, blurred vision, chest pain, headaches, malaise/fatigue, neck pain, orthopnea, palpitations, peripheral edema, PND, shortness of breath or sweats. There are no associated agents to hypertension. There are no known risk factors for coronary artery disease. Past treatments include angiotensin blockers and diuretics. The current treatment provides mild improvement. There are no compliance problems.  There is no history of angina, kidney disease, CAD/MI, CVA, heart failure, left ventricular hypertrophy, PVD, renovascular disease or retinopathy. There is no history of chronic renal disease or a hypertension causing med.  Diabetes  She presents for her follow-up diabetic visit. She has type 2 diabetes mellitus. Her disease course has been stable. There are no hypoglycemic associated symptoms. Pertinent negatives for hypoglycemia include no dizziness, headaches, nervousness/anxiousness or sweats. Associated symptoms include fatigue. Pertinent negatives for diabetes include no blurred vision, no chest pain, no foot paresthesias, no foot ulcerations, no polydipsia, no polyphagia, no polyuria, no visual change, no weakness and no weight loss. There are no hypoglycemic complications. There are no diabetic complications. Pertinent negatives for diabetic complications include no CVA, PVD or retinopathy. Risk factors for coronary artery disease include dyslipidemia, hypertension and obesity. Current diabetic treatment includes oral agent (dual therapy). She is compliant with treatment most of the time. Her weight is  increasing steadily. She is following a generally healthy diet. She participates in exercise daily. Her breakfast blood glucose is taken between 8-9 am. Her breakfast blood glucose range is generally >200 mg/dl. An ACE inhibitor/angiotensin II receptor blocker is being taken. She does not see a podiatrist.Eye exam is current.  Hyperlipidemia  This is a new problem. The problem is controlled. Recent lipid tests were reviewed and are normal. Exacerbating diseases include obesity. She has no history of chronic renal disease, diabetes, hypothyroidism, liver disease or nephrotic syndrome. Factors aggravating her hyperlipidemia include thiazides. Pertinent negatives include no chest pain, focal weakness, myalgias or shortness of breath. She is currently on no antihyperlipidemic treatment. Compliance problems include medication cost.  Risk factors for coronary artery disease include diabetes mellitus, hypertension, obesity and stress.    No problem-specific Assessment & Plan notes found for this encounter.   Past Medical History:  Diagnosis Date  . Anemia   . Hypertension     Past Surgical History:  Procedure Laterality Date  . Gastric bypass surgery      Family History  Problem Relation Age of Onset  . Diabetes Father   . Diabetes Paternal Grandmother     Social History   Social History  . Marital status: Single    Spouse name: N/A  . Number of children: N/A  . Years of education: N/A   Occupational History  . Not on file.   Social History Main Topics  . Smoking status: Never Smoker  . Smokeless tobacco: Not on file  . Alcohol use No  . Drug use: No  . Sexual activity: Yes   Other Topics Concern  . Not on file   Social History Narrative  . No narrative on file    No Known Allergies   Review of  Systems  Constitutional: Positive for fatigue. Negative for chills, fever, malaise/fatigue and weight loss.  HENT: Negative for ear discharge, ear pain and sore throat.   Eyes:  Negative for blurred vision.  Respiratory: Negative for cough, sputum production, shortness of breath and wheezing.   Cardiovascular: Negative for chest pain, palpitations, orthopnea, leg swelling and PND.  Gastrointestinal: Negative for abdominal pain, blood in stool, constipation, diarrhea, heartburn, melena and nausea.  Genitourinary: Negative for dysuria, frequency, hematuria and urgency.  Musculoskeletal: Negative for back pain, joint pain, myalgias and neck pain.  Skin: Negative for rash.  Neurological: Negative for dizziness, tingling, sensory change, focal weakness, weakness and headaches.  Endo/Heme/Allergies: Negative for environmental allergies, polydipsia and polyphagia. Does not bruise/bleed easily.  Psychiatric/Behavioral: Negative for depression and suicidal ideas. The patient is not nervous/anxious and does not have insomnia.      Objective  Vitals:   04/18/16 0915  BP: 110/70  Pulse: 80  Weight: (!) 309 lb (140.2 kg)  Height: 5\' 6"  (1.676 m)    Physical Exam  Constitutional: She is well-developed, well-nourished, and in no distress. No distress.  HENT:  Head: Normocephalic and atraumatic.  Right Ear: External ear normal.  Left Ear: External ear normal.  Nose: Nose normal.  Mouth/Throat: Oropharynx is clear and moist.  Eyes: Conjunctivae and EOM are normal. Pupils are equal, round, and reactive to light. Right eye exhibits no discharge. Left eye exhibits no discharge.  Neck: Normal range of motion. Neck supple. No JVD present. No thyromegaly present.  Cardiovascular: Normal rate, regular rhythm, normal heart sounds and intact distal pulses.  Exam reveals no gallop and no friction rub.   No murmur heard. Pulmonary/Chest: Effort normal and breath sounds normal.  Abdominal: Soft. Bowel sounds are normal. She exhibits no mass. There is no tenderness. There is no guarding.  Musculoskeletal: Normal range of motion. She exhibits no edema.  Lymphadenopathy:    She has  no cervical adenopathy.  Neurological: She is alert.  Skin: Skin is warm and dry. She is not diaphoretic.  Psychiatric: Mood and affect normal.  Nursing note and vitals reviewed.     Assessment & Plan  Problem List Items Addressed This Visit      Cardiovascular and Mediastinum   Essential hypertension - Primary   Relevant Medications   losartan-hydrochlorothiazide (HYZAAR) 100-12.5 MG tablet   aspirin 81 MG tablet   Other Relevant Orders   Renal Function Panel     Endocrine   Type 2 diabetes mellitus without complication, without long-term current use of insulin (HCC)   Relevant Medications   losartan-hydrochlorothiazide (HYZAAR) 100-12.5 MG tablet   metFORMIN (GLUCOPHAGE XR) 500 MG 24 hr tablet   glipiZIDE (GLUCOTROL) 10 MG tablet   aspirin 81 MG tablet   Other Relevant Orders   Hemoglobin A1c   Renal Function Panel     Other   Hyperlipidemia   Relevant Medications   losartan-hydrochlorothiazide (HYZAAR) 100-12.5 MG tablet   aspirin 81 MG tablet   Other Relevant Orders   Lipid Profile    Other Visit Diagnoses    Flu vaccine need       Relevant Orders   Flu Vaccine QUAD 36+ mos PF IM (Fluarix & Fluzone Quad PF) (Completed)        Dr. Hayden Rasmusseneanna Jones [facility]  04/18/16

## 2016-04-19 LAB — HEMOGLOBIN A1C
Est. average glucose Bld gHb Est-mCnc: 235 mg/dL
Hgb A1c MFr Bld: 9.8 % — ABNORMAL HIGH (ref 4.8–5.6)

## 2016-04-19 LAB — RENAL FUNCTION PANEL
Albumin: 4.2 g/dL (ref 3.5–5.5)
BUN/Creatinine Ratio: 15 (ref 9–23)
BUN: 10 mg/dL (ref 6–20)
CO2: 25 mmol/L (ref 18–29)
Calcium: 9.4 mg/dL (ref 8.7–10.2)
Chloride: 94 mmol/L — ABNORMAL LOW (ref 96–106)
Creatinine, Ser: 0.68 mg/dL (ref 0.57–1.00)
GFR calc Af Amer: 128 mL/min/{1.73_m2} (ref >59)
GFR calc non Af Amer: 111 mL/min/{1.73_m2} (ref >59)
Glucose: 269 mg/dL — ABNORMAL HIGH (ref 65–99)
Phosphorus: 4.2 mg/dL (ref 2.5–4.5)
Potassium: 4.7 mmol/L (ref 3.5–5.2)
Sodium: 134 mmol/L (ref 134–144)

## 2016-04-19 LAB — LIPID PANEL
Chol/HDL Ratio: 6.2 ratio units — ABNORMAL HIGH (ref 0.0–4.4)
Cholesterol, Total: 218 mg/dL — ABNORMAL HIGH (ref 100–199)
HDL: 35 mg/dL — ABNORMAL LOW (ref >39)
LDL Calculated: 154 mg/dL — ABNORMAL HIGH (ref 0–99)
Triglycerides: 143 mg/dL (ref 0–149)
VLDL Cholesterol Cal: 29 mg/dL (ref 5–40)

## 2016-05-06 ENCOUNTER — Ambulatory Visit: Payer: BLUE CROSS/BLUE SHIELD | Admitting: Family Medicine

## 2016-08-29 ENCOUNTER — Encounter: Payer: Self-pay | Admitting: Family Medicine

## 2016-08-29 ENCOUNTER — Ambulatory Visit (INDEPENDENT_AMBULATORY_CARE_PROVIDER_SITE_OTHER): Payer: BLUE CROSS/BLUE SHIELD | Admitting: Family Medicine

## 2016-08-29 VITALS — BP 118/72 | HR 80 | Ht 67.0 in | Wt 316.0 lb

## 2016-08-29 DIAGNOSIS — I10 Essential (primary) hypertension: Secondary | ICD-10-CM

## 2016-08-29 DIAGNOSIS — Z23 Encounter for immunization: Secondary | ICD-10-CM | POA: Diagnosis not present

## 2016-08-29 DIAGNOSIS — E1159 Type 2 diabetes mellitus with other circulatory complications: Secondary | ICD-10-CM

## 2016-08-29 DIAGNOSIS — Z124 Encounter for screening for malignant neoplasm of cervix: Secondary | ICD-10-CM

## 2016-08-29 DIAGNOSIS — Z0000 Encounter for general adult medical examination without abnormal findings: Secondary | ICD-10-CM

## 2016-08-29 DIAGNOSIS — Z9119 Patient's noncompliance with other medical treatment and regimen: Secondary | ICD-10-CM

## 2016-08-29 DIAGNOSIS — Z91199 Patient's noncompliance with other medical treatment and regimen due to unspecified reason: Secondary | ICD-10-CM

## 2016-08-29 DIAGNOSIS — I152 Hypertension secondary to endocrine disorders: Secondary | ICD-10-CM

## 2016-08-29 DIAGNOSIS — E119 Type 2 diabetes mellitus without complications: Secondary | ICD-10-CM | POA: Diagnosis not present

## 2016-08-29 NOTE — Progress Notes (Signed)
Name: Jeanette Flores   MRN: [id number]    DOB: Oct 19, 1975   Date:08/29/2016       Progress Note  Subjective  Chief Complaint  Chief Complaint  Patient presents with  . Annual Exam    with pap  . Breast Pain    L) breast while on Menstrual period    Patient presents for annual physical with breast/pap/pellvic exam.   Diabetes  She presents for her follow-up diabetic visit. She has type 2 diabetes mellitus. Her disease course has been stable. There are no hypoglycemic associated symptoms. Pertinent negatives for hypoglycemia include no dizziness, headaches, nervousness/anxiousness or sweats. Pertinent negatives for diabetes include no blurred vision, no chest pain, no fatigue, no foot paresthesias, no foot ulcerations, no polydipsia, no polyphagia, no polyuria, no visual change, no weakness and no weight loss. There are no hypoglycemic complications. Symptoms are stable. There are no diabetic complications. Pertinent negatives for diabetic complications include no autonomic neuropathy, CVA, heart disease, impotence, nephropathy, peripheral neuropathy or retinopathy. Risk factors for coronary artery disease include diabetes mellitus, obesity and hypertension. Current diabetic treatment includes oral agent (dual therapy). Her weight is stable. She is following a generally unhealthy diet. She participates in exercise intermittently. Her breakfast blood glucose is taken between 8-9 am. Her breakfast blood glucose range is generally 130-140 mg/dl. An ACE inhibitor/angiotensin II receptor blocker is being taken. She does not see a podiatrist.Eye exam is not current.  Hypertension  This is a chronic problem. The current episode started more than 1 year ago. The problem has been waxing and waning since onset. The problem is controlled. Pertinent negatives include no anxiety, blurred vision, chest pain, headaches, malaise/fatigue, neck pain, orthopnea, palpitations, peripheral edema, PND, shortness of breath  or sweats. There are no associated agents to hypertension. Risk factors for coronary artery disease include diabetes mellitus, dyslipidemia and obesity. Past treatments include diuretics and angiotensin blockers. Compliance problems include diet (medication ).  There is no history of angina, kidney disease, CAD/MI, CVA, heart failure or retinopathy. There is no history of chronic renal disease or a hypertension causing med.    No problem-specific Assessment & Plan notes found for this encounter.   Past Medical History:  Diagnosis Date  . Anemia   . Hypertension     Past Surgical History:  Procedure Laterality Date  . Gastric bypass surgery      Family History  Problem Relation Age of Onset  . Diabetes Father   . Diabetes Paternal Grandmother     Social History   Social History  . Marital status: Single    Spouse name: N/A  . Number of children: N/A  . Years of education: N/A   Occupational History  . Not on file.   Social History Main Topics  . Smoking status: Never Smoker  . Smokeless tobacco: Never Used  . Alcohol use No  . Drug use: No  . Sexual activity: Yes   Other Topics Concern  . Not on file   Social History Narrative  . No narrative on file    No Known Allergies  Outpatient Medications Prior to Visit  Medication Sig Dispense Refill  . aspirin 81 MG tablet Take 1 tablet (81 mg total) by mouth daily. 30 tablet 11  . glipiZIDE (GLUCOTROL) 10 MG tablet TAKE ONE TABLET BY MOUTH TWICE DAILY BEFORE MEAL(S) 180 tablet 1  . losartan-hydrochlorothiazide (HYZAAR) 100-12.5 MG tablet Take 1 tablet by mouth daily. 90 tablet 1  . metFORMIN (GLUCOPHAGE XR) 500 MG  24 hr tablet Take 1 tablet (500 mg total) by mouth daily with breakfast. 90 tablet 1   No facility-administered medications prior to visit.     Review of Systems  Constitutional: Negative for chills, fatigue, fever, malaise/fatigue and weight loss.  HENT: Negative for ear discharge, ear pain and sore  throat.   Eyes: Negative for blurred vision.  Respiratory: Negative for cough, sputum production, shortness of breath and wheezing.   Cardiovascular: Negative for chest pain, palpitations, orthopnea, leg swelling and PND.  Gastrointestinal: Negative for abdominal pain, blood in stool, constipation, diarrhea, heartburn, melena and nausea.  Genitourinary: Negative for dysuria, frequency, hematuria, impotence and urgency.  Musculoskeletal: Negative for back pain, joint pain, myalgias and neck pain.  Skin: Negative for rash.  Neurological: Negative for dizziness, tingling, sensory change, focal weakness, weakness and headaches.  Endo/Heme/Allergies: Negative for environmental allergies, polydipsia and polyphagia. Does not bruise/bleed easily.  Psychiatric/Behavioral: Negative for depression and suicidal ideas. The patient is not nervous/anxious and does not have insomnia.      Objective  Vitals:   08/29/16 0842  BP: 118/72  Pulse: 80  Weight: (!) 316 lb (143.3 kg)  Height: 5\' 7"  (1.702 m)    Physical Exam  Constitutional: She is well-developed, well-nourished, and in no distress. No distress.  HENT:  Head: Normocephalic and atraumatic.  Right Ear: Tympanic membrane, external ear and ear canal normal.  Left Ear: Tympanic membrane, external ear and ear canal normal.  Nose: Nose normal.  Mouth/Throat: Uvula is midline, oropharynx is clear and moist and mucous membranes are normal.  Eyes: Conjunctivae and EOM are normal. Pupils are equal, round, and reactive to light. Right eye exhibits no discharge. Left eye exhibits no discharge.  Neck: Trachea normal and normal range of motion. Neck supple. Normal carotid pulses, no hepatojugular reflux and no JVD present. Carotid bruit is not present. No thyromegaly present.  Cardiovascular: Normal rate, regular rhythm, normal heart sounds, intact distal pulses and normal pulses.  PMI is not displaced.  Exam reveals no gallop and no friction rub.   No  murmur heard. Pulmonary/Chest: Effort normal and breath sounds normal. No accessory muscle usage. No respiratory distress. She has no wheezes. She has no rales.  Abdominal: Soft. Normal aorta and bowel sounds are normal. She exhibits no mass. There is no hepatosplenomegaly or hepatomegaly. There is no tenderness. There is no guarding.  Genitourinary: Rectum normal, vagina normal, uterus normal, cervix normal, right adnexa normal, left adnexa normal and vulva normal.  Musculoskeletal: Normal range of motion. She exhibits no edema.  Lymphadenopathy:    She has no cervical adenopathy.  Neurological: She is alert. She has normal motor skills, normal sensation, normal strength, normal reflexes and intact cranial nerves.  Skin: Skin is warm, dry and intact. She is not diaphoretic.  Psychiatric: Mood and affect normal.  Nursing note and vitals reviewed.     Assessment & Plan  Problem List Items Addressed This Visit    None    Visit Diagnoses    Annual physical exam    -  Primary   Relevant Orders   Pap IG (Image Guided)   Pap smear for cervical cancer screening       Relevant Orders   Pap IG (Image Guided)   Hypertension associated with diabetes (HCC)       Relevant Orders   Renal Function Panel   Controlled type 2 diabetes mellitus without complication, without long-term current use of insulin (HCC)  Relevant Orders   Hemoglobin A1c   Renal Function Panel   Lipid Profile   Microalbumin / creatinine urine ratio   Morbid obesity (HCC)       Noncompliance       Need for diphtheria-tetanus-pertussis (Tdap) vaccine       Relevant Orders   Tdap vaccine greater than or equal to 7yo IM (Completed)      No orders of the defined types were placed in this encounter.     Dr. Hayden [facility]  08/29/16

## 2016-08-30 ENCOUNTER — Other Ambulatory Visit: Payer: Self-pay

## 2016-08-30 DIAGNOSIS — E1165 Type 2 diabetes mellitus with hyperglycemia: Secondary | ICD-10-CM

## 2016-08-30 LAB — RENAL FUNCTION PANEL
Albumin: 4 g/dL (ref 3.5–5.5)
BUN/Creatinine Ratio: 20 (ref 9–23)
BUN: 13 mg/dL (ref 6–20)
CO2: 27 mmol/L (ref 18–29)
Calcium: 9.4 mg/dL (ref 8.7–10.2)
Chloride: 98 mmol/L (ref 96–106)
Creatinine, Ser: 0.64 mg/dL (ref 0.57–1.00)
GFR calc Af Amer: 131 mL/min/{1.73_m2} (ref >59)
GFR calc non Af Amer: 114 mL/min/{1.73_m2} (ref >59)
Glucose: 147 mg/dL — ABNORMAL HIGH (ref 65–99)
Phosphorus: 4.8 mg/dL — ABNORMAL HIGH (ref 2.5–4.5)
Potassium: 4.5 mmol/L (ref 3.5–5.2)
Sodium: 139 mmol/L (ref 134–144)

## 2016-08-30 LAB — HEMOGLOBIN A1C
Est. average glucose Bld gHb Est-mCnc: 226 mg/dL
Hgb A1c MFr Bld: 9.5 % — ABNORMAL HIGH (ref 4.8–5.6)

## 2016-08-30 LAB — LIPID PANEL
Chol/HDL Ratio: 6.5 ratio units — ABNORMAL HIGH (ref 0.0–4.4)
Cholesterol, Total: 214 mg/dL — ABNORMAL HIGH (ref 100–199)
HDL: 33 mg/dL — ABNORMAL LOW (ref >39)
LDL Calculated: 153 mg/dL — ABNORMAL HIGH (ref 0–99)
Triglycerides: 138 mg/dL (ref 0–149)
VLDL Cholesterol Cal: 28 mg/dL (ref 5–40)

## 2016-08-30 LAB — MICROALBUMIN / CREATININE URINE RATIO

## 2016-09-02 ENCOUNTER — Ambulatory Visit (INDEPENDENT_AMBULATORY_CARE_PROVIDER_SITE_OTHER): Payer: BLUE CROSS/BLUE SHIELD | Admitting: Family Medicine

## 2016-09-02 VITALS — BP 100/70 | HR 80 | Temp 97.9°F | Ht 67.0 in | Wt 317.0 lb

## 2016-09-02 DIAGNOSIS — T881XXA Other complications following immunization, not elsewhere classified, initial encounter: Secondary | ICD-10-CM | POA: Diagnosis not present

## 2016-09-02 MED ORDER — AMOXICILLIN-POT CLAVULANATE 875-125 MG PO TABS: 1.0000 | ORAL_TABLET | Freq: Two times a day (BID) | ORAL | 0 refills | Status: DC

## 2016-09-02 NOTE — Progress Notes (Signed)
Name: Jeanette Flores   MRN: [id number]    DOB: 1976/03/14   Date:09/02/2016       Progress Note  Subjective  Chief Complaint  Chief Complaint  Patient presents with  . tdap allergy    red, swollen, running a low grade fever over the weekend    Fever   This is a new problem. The current episode started in the past 7 days. The problem occurs constantly. The problem has been gradually worsening. The maximum temperature noted was 100 to 100.9 F. The temperature was taken using an oral thermometer. Associated symptoms include muscle aches and a rash. Pertinent negatives include no abdominal pain, chest pain, congestion, coughing, diarrhea, ear pain, headaches, nausea, sleepiness, sore throat, urinary pain, vomiting or wheezing. She has tried NSAIDs for the symptoms. The treatment provided mild relief.    No problem-specific Assessment & Plan notes found for this encounter.   Past Medical History:  Diagnosis Date  . Anemia   . Hypertension     Past Surgical History:  Procedure Laterality Date  . Gastric bypass surgery      Family History  Problem Relation Age of Onset  . Diabetes Father   . Diabetes Paternal Grandmother     Social History   Social History  . Marital status: Single    Spouse name: N/A  . Number of children: N/A  . Years of education: N/A   Occupational History  . Not on file.   Social History Main Topics  . Smoking status: Never Smoker  . Smokeless tobacco: Never Used  . Alcohol use No  . Drug use: No  . Sexual activity: Yes   Other Topics Concern  . Not on file   Social History Narrative  . No narrative on file    No Known Allergies  Outpatient Medications Prior to Visit  Medication Sig Dispense Refill  . aspirin 81 MG tablet Take 1 tablet (81 mg total) by mouth daily. 30 tablet 11  . ferrous sulfate 325 (65 FE) MG tablet Take 325 mg by mouth daily with breakfast. otc    . glipiZIDE (GLUCOTROL) 10 MG tablet TAKE ONE TABLET BY MOUTH TWICE  DAILY BEFORE MEAL(S) 180 tablet 1  . losartan-hydrochlorothiazide (HYZAAR) 100-12.5 MG tablet Take 1 tablet by mouth daily. 90 tablet 1  . metFORMIN (GLUCOPHAGE XR) 500 MG 24 hr tablet Take 1 tablet (500 mg total) by mouth daily with breakfast. 90 tablet 1   No facility-administered medications prior to visit.     Review of Systems  Constitutional: Positive for fever. Negative for chills, malaise/fatigue and weight loss.  HENT: Negative for congestion, ear discharge, ear pain and sore throat.   Eyes: Negative for blurred vision.  Respiratory: Negative for cough, sputum production, shortness of breath and wheezing.   Cardiovascular: Negative for chest pain, palpitations and leg swelling.  Gastrointestinal: Negative for abdominal pain, blood in stool, constipation, diarrhea, heartburn, melena, nausea and vomiting.  Genitourinary: Negative for dysuria, frequency, hematuria and urgency.  Musculoskeletal: Negative for back pain, joint pain, myalgias and neck pain.  Skin: Positive for rash.  Neurological: Negative for dizziness, tingling, sensory change, focal weakness and headaches.  Endo/Heme/Allergies: Negative for environmental allergies and polydipsia. Does not bruise/bleed easily.  Psychiatric/Behavioral: Negative for depression and suicidal ideas. The patient is not nervous/anxious and does not have insomnia.      Objective  Vitals:   09/02/16 0831  BP: 100/70  Pulse: 80  Temp: 97.9 F (36.6 C)  TempSrc: Oral  Weight: (!) 317 lb (143.8 kg)  Height: 5\' 7"  (1.702 m)    Physical Exam  Constitutional: She is well-developed, well-nourished, and in no distress. No distress.  HENT:  Head: Normocephalic and atraumatic.  Right Ear: External ear normal.  Left Ear: External ear normal.  Nose: Nose normal.  Mouth/Throat: Oropharynx is clear and moist.  Eyes: Conjunctivae and EOM are normal. Pupils are equal, round, and reactive to light. Right eye exhibits no discharge. Left eye  exhibits no discharge.  Neck: Normal range of motion. Neck supple. No JVD present. No thyromegaly present.  Cardiovascular: Normal rate, regular rhythm, normal heart sounds and intact distal pulses.  Exam reveals no gallop and no friction rub.   No murmur heard. Pulmonary/Chest: Effort normal and breath sounds normal. She has no wheezes. She has no rales.  Abdominal: Soft. Bowel sounds are normal. She exhibits no mass. There is no tenderness. There is no guarding.  Musculoskeletal: Normal range of motion. She exhibits no edema.  Lymphadenopathy:    She has no cervical adenopathy.  Neurological: She is alert.  Skin: Skin is warm and dry. No bruising noted. She is not diaphoretic. There is erythema.  Left arm  Psychiatric: Mood and affect normal.  Nursing note and vitals reviewed.     Assessment & Plan  Problem List Items Addressed This Visit    None    Visit Diagnoses    Post-immunization reaction, initial encounter    -  Primary   Relevant Medications   amoxicillin-clavulanate (AUGMENTIN) 875-125 MG tablet      Meds ordered this encounter  Medications  . amoxicillin-clavulanate (AUGMENTIN) 875-125 MG tablet    Sig: Take 1 tablet by mouth 2 (two) times daily.    Dispense:  20 tablet    Refill:  0      Dr. Elizabeth Sauereanna Shatia [facility]  09/02/16

## 2016-09-04 LAB — PAP IG (IMAGE GUIDED): PAP Smear Comment: 0

## 2016-10-14 DIAGNOSIS — E1165 Type 2 diabetes mellitus with hyperglycemia: Secondary | ICD-10-CM | POA: Diagnosis not present

## 2016-12-04 ENCOUNTER — Inpatient Hospital Stay: Payer: BLUE CROSS/BLUE SHIELD | Attending: Oncology

## 2016-12-04 ENCOUNTER — Other Ambulatory Visit: Payer: Self-pay

## 2016-12-04 DIAGNOSIS — Z7984 Long term (current) use of oral hypoglycemic drugs: Secondary | ICD-10-CM | POA: Insufficient documentation

## 2016-12-04 DIAGNOSIS — Z9884 Bariatric surgery status: Secondary | ICD-10-CM | POA: Diagnosis not present

## 2016-12-04 DIAGNOSIS — R531 Weakness: Secondary | ICD-10-CM | POA: Insufficient documentation

## 2016-12-04 DIAGNOSIS — Z79899 Other long term (current) drug therapy: Secondary | ICD-10-CM | POA: Diagnosis not present

## 2016-12-04 DIAGNOSIS — R51 Headache: Secondary | ICD-10-CM | POA: Insufficient documentation

## 2016-12-04 DIAGNOSIS — Z7982 Long term (current) use of aspirin: Secondary | ICD-10-CM | POA: Insufficient documentation

## 2016-12-04 DIAGNOSIS — I10 Essential (primary) hypertension: Secondary | ICD-10-CM | POA: Insufficient documentation

## 2016-12-04 DIAGNOSIS — D509 Iron deficiency anemia, unspecified: Secondary | ICD-10-CM | POA: Insufficient documentation

## 2016-12-04 DIAGNOSIS — R5383 Other fatigue: Secondary | ICD-10-CM | POA: Insufficient documentation

## 2016-12-04 LAB — CBC WITH DIFFERENTIAL/PLATELET
Basophils Absolute: 0.1 10*3/uL (ref 0–0.1)
Basophils Relative: 1 %
Eosinophils Absolute: 0.6 10*3/uL (ref 0–0.7)
Eosinophils Relative: 7 %
HCT: 36.3 % (ref 35.0–47.0)
Hemoglobin: 11.7 g/dL — ABNORMAL LOW (ref 12.0–16.0)
Lymphocytes Relative: 30 %
Lymphs Abs: 2.9 10*3/uL (ref 1.0–3.6)
MCH: 24.3 pg — ABNORMAL LOW (ref 26.0–34.0)
MCHC: 32.3 g/dL (ref 32.0–36.0)
MCV: 75.2 fL — ABNORMAL LOW (ref 80.0–100.0)
Monocytes Absolute: 0.6 10*3/uL (ref 0.2–0.9)
Monocytes Relative: 6 %
Neutro Abs: 5.3 10*3/uL (ref 1.4–6.5)
Neutrophils Relative %: 56 %
Platelets: 391 10*3/uL (ref 150–440)
RBC: 4.83 MIL/uL (ref 3.80–5.20)
RDW: 15.9 % — ABNORMAL HIGH (ref 11.5–14.5)
WBC: 9.5 10*3/uL (ref 3.6–11.0)

## 2016-12-04 LAB — IRON AND TIBC
Iron: 47 ug/dL (ref 28–170)
Saturation Ratios: 11 % (ref 10.4–31.8)
TIBC: 450 ug/dL (ref 250–450)
UIBC: 403 ug/dL

## 2016-12-04 LAB — FERRITIN: Ferritin: 44 ng/mL (ref 11–307)

## 2016-12-05 NOTE — Progress Notes (Signed)
[facility]  Telephone:(336) [phone] Fax:(336) [phone]  ID: Jeanette Flores OB: 09-10-1975  MR#: [id number]  AOZ#:[id number]#:[id number]  Patient Care Team: [name], [name], MD as PCP - General (Family Medicine)  CHIEF COMPLAINT: Iron deficiency anemia  INTERVAL HISTORY: Patient last seen in clinic in May 2017. Patient states she has noticed increased weakness and fatigue over the past several weeks. She also has a headache over the past 2 weeks. She continues to be active and work full-time.  She denies any neurologic symptoms. She denies any fever, chills, or night sweats.  She denies any chest pain, shortness of breath, or cough.  She denies any nausea, vomiting, constipation, or diarrhea.  She continues to have heavy menses.  Patient offers no further specific complaints today.   REVIEW OF SYSTEMS:   Review of Systems  Constitutional: Positive for malaise/fatigue. Negative for fever and weight loss.  Respiratory: Negative.  Negative for cough and shortness of breath.   Cardiovascular: Negative.  Negative for chest pain and leg swelling.  Gastrointestinal: Negative.  Negative for abdominal pain, blood in stool and melena.  Musculoskeletal: Negative.   Skin: Negative.  Negative for rash.  Neurological: Positive for weakness and headaches.  Psychiatric/Behavioral: Negative.  The patient is not nervous/anxious.     As per HPI. Otherwise, a complete review of systems is negative.  PAST MEDICAL HISTORY: Past Medical History:  Diagnosis Date  . Anemia   . Hypertension     PAST SURGICAL HISTORY: Past Surgical History:  Procedure Laterality Date  . Gastric bypass surgery     FAMILY HISTORY: Colon and breast cancer. No history of clotting or bleeding disorder.      ADVANCED DIRECTIVES:    HEALTH MAINTENANCE: Social History  Substance Use Topics  . Smoking status: Never Smoker  . Smokeless tobacco: Never Used  . Alcohol use No     Colonoscopy:  PAP:  Bone  density:  Lipid panel:  No Known Allergies  Current Outpatient Prescriptions  Medication Sig Dispense Refill  . aspirin 81 MG tablet Take 1 tablet (81 mg total) by mouth daily. 30 tablet 11  . ferrous sulfate 325 (65 FE) MG tablet Take 325 mg by mouth daily with breakfast. otc    . glipiZIDE (GLUCOTROL) 10 MG tablet TAKE ONE TABLET BY MOUTH TWICE DAILY BEFORE MEAL(S) 180 tablet 1  . metFORMIN (GLUCOPHAGE XR) 500 MG 24 hr tablet Take 1 tablet (500 mg total) by mouth daily with breakfast. 90 tablet 1  . losartan-hydrochlorothiazide (HYZAAR) 100-12.5 MG tablet TAKE ONE TABLET BY MOUTH ONCE DAILY 90 tablet 0   No current facility-administered medications for this visit.     OBJECTIVE: Vitals:   12/06/16 0954  BP: 128/84  Pulse: 99  Resp: 20  Temp: 98.4 F (36.9 C)     Body mass index is 50.27 kg/m.    ECOG FS:0 - Asymptomatic  General: Well-developed, well-nourished, no acute distress. Eyes: Pink conjunctiva, anicteric sclera. Lungs: Clear to auscultation bilaterally. Heart: Regular rate and rhythm. No rubs, murmurs, or gallops. Abdomen: Soft, nontender, nondistended. No organomegaly noted, normoactive bowel sounds. Musculoskeletal: No edema, cyanosis, or clubbing. Neuro: Alert, answering all questions appropriately. Cranial nerves grossly intact. Skin: No rashes or petechiae noted. Psych: Normal affect.  LAB RESULTS:  Lab Results  Component Value Date   NA 139 08/29/2016   K 4.5 08/29/2016   CL 98 08/29/2016   CO2 27 08/29/2016   GLUCOSE 147 (H) 08/29/2016   BUN 13 08/29/2016  CREATININE 0.64 08/29/2016   CALCIUM 9.4 08/29/2016   ALBUMIN 4.0 08/29/2016   GFRNONAA 114 08/29/2016   GFRAA 131 08/29/2016    Lab Results  Component Value Date   WBC 9.5 12/04/2016   NEUTROABS 5.3 12/04/2016   HGB 11.7 (L) 12/04/2016   HCT 36.3 12/04/2016   MCV 75.2 (L) 12/04/2016   PLT 391 12/04/2016   Lab Results  Component Value Date   IRON 47 12/04/2016   TIBC 450  12/04/2016   IRONPCTSAT 11 12/04/2016    Lab Results  Component Value Date   FERRITIN 44 12/04/2016     STUDIES: No results found.  ASSESSMENT: Iron deficiency anemia.  PLAN:    1.  Iron deficiency anemia: Patient's hemoglobin is Mildly decreased, but unchanged. Her iron stores continue to be within normal limits. She does not require IV Feraheme today. Patient last received treatment in May 2017. She has been instructed to continue her oral iron supplementation. No follow-up has been scheduled.  2. Headaches: Patient states she will contact her primary care physician for further evaluation.   Patient expressed understanding and was in agreement with this plan. She also understands that She can call clinic at any time with any questions, concerns, or complaints.     Jeralyn Ruths, MD   12/06/2016 12:56 PM

## 2016-12-06 ENCOUNTER — Inpatient Hospital Stay (HOSPITAL_BASED_OUTPATIENT_CLINIC_OR_DEPARTMENT_OTHER): Payer: BLUE CROSS/BLUE SHIELD | Admitting: Oncology

## 2016-12-06 ENCOUNTER — Encounter: Payer: Self-pay | Admitting: Family Medicine

## 2016-12-06 ENCOUNTER — Ambulatory Visit
Admission: RE | Admit: 2016-12-06 | Discharge: 2016-12-06 | Disposition: A | Discharge status: Home / Self Care | Payer: BLUE CROSS/BLUE SHIELD | Source: Ambulatory Visit | Attending: Family Medicine | Admitting: Family Medicine

## 2016-12-06 ENCOUNTER — Other Ambulatory Visit: Payer: Self-pay | Admitting: Family Medicine

## 2016-12-06 ENCOUNTER — Inpatient Hospital Stay: Payer: BLUE CROSS/BLUE SHIELD

## 2016-12-06 ENCOUNTER — Ambulatory Visit (INDEPENDENT_AMBULATORY_CARE_PROVIDER_SITE_OTHER): Payer: BLUE CROSS/BLUE SHIELD | Admitting: Family Medicine

## 2016-12-06 ENCOUNTER — Encounter: Payer: Self-pay | Admitting: Oncology

## 2016-12-06 ENCOUNTER — Other Ambulatory Visit: Payer: Self-pay

## 2016-12-06 VITALS — BP 138/80 | HR 92 | Ht 67.0 in | Wt 321.0 lb

## 2016-12-06 VITALS — BP 128/84 | HR 99 | Temp 98.4°F | Resp 20 | Ht 67.0 in | Wt 321.0 lb

## 2016-12-06 DIAGNOSIS — Z79899 Other long term (current) drug therapy: Secondary | ICD-10-CM

## 2016-12-06 DIAGNOSIS — Z7982 Long term (current) use of aspirin: Secondary | ICD-10-CM | POA: Diagnosis not present

## 2016-12-06 DIAGNOSIS — I10 Essential (primary) hypertension: Secondary | ICD-10-CM

## 2016-12-06 DIAGNOSIS — J349 Unspecified disorder of nose and nasal sinuses: Secondary | ICD-10-CM | POA: Diagnosis not present

## 2016-12-06 DIAGNOSIS — D509 Iron deficiency anemia, unspecified: Secondary | ICD-10-CM

## 2016-12-06 DIAGNOSIS — Z9884 Bariatric surgery status: Secondary | ICD-10-CM

## 2016-12-06 DIAGNOSIS — G44229 Chronic tension-type headache, not intractable: Secondary | ICD-10-CM | POA: Insufficient documentation

## 2016-12-06 DIAGNOSIS — Z7984 Long term (current) use of oral hypoglycemic drugs: Secondary | ICD-10-CM

## 2016-12-06 DIAGNOSIS — R51 Headache: Secondary | ICD-10-CM | POA: Diagnosis not present

## 2016-12-06 DIAGNOSIS — R531 Weakness: Secondary | ICD-10-CM | POA: Diagnosis not present

## 2016-12-06 DIAGNOSIS — R5383 Other fatigue: Secondary | ICD-10-CM

## 2016-12-06 MED ORDER — FLUTICASONE PROPIONATE 50 MCG/ACT NA SUSP: 2.0000 | Freq: Every day | NASAL | 6 refills | Status: DC

## 2016-12-06 NOTE — Progress Notes (Signed)
Name: Jeanette Flores   MRN: [id number]    DOB: [birth year]   Date:12/06/2016       Progress Note  Subjective  Chief Complaint  Chief Complaint  Patient presents with  . Headache    Past 2 weeks- Onset of period my head started "banging". Did labs with Dr. Risa GrillFinergan. Iron was still up. Not enough to do infusion. Thinks anemia is not reason for headache. Tylenol gives no relief. Took vicodin at about 11 and made drowsy but still not going away.  Makes hard to concentrate and can't even finish sentences at times,     Headache   This is a new problem. The current episode started 1 to 4 weeks ago (2weeks). The problem occurs constantly (no nocturnal awakening). The problem has been waxing and waning (6 to 8). Pain location: generalized. The pain does not radiate. The pain quality is not similar to prior headaches. The quality of the pain is described as aching and throbbing. The pain is at a severity of 3/10. The pain is moderate. Associated symptoms include dizziness, a loss of balance, nausea, neck pain, photophobia, rhinorrhea and sinus pressure. Pertinent negatives include no abdominal pain, abnormal behavior, anorexia, back pain, blurred vision, coughing, drainage, ear pain, eye pain, eye redness, eye watering, facial sweating, fever, hearing loss, insomnia, muscle aches, numbness, phonophobia, scalp tenderness, seizures, sore throat, swollen glands, tingling, tinnitus, visual change, vomiting, weakness or weight loss. The symptoms are aggravated by bright light (menstrual period). She has tried NSAIDs and acetaminophen for the symptoms. The treatment provided moderate relief. Her past medical history is significant for hypertension, obesity and TMJ.    No problem-specific Assessment & Plan notes found for this encounter.   Past Medical History:  Diagnosis Date  . Anemia   . Hypertension     Past Surgical History:  Procedure Laterality Date  . Gastric bypass surgery      Family History   Problem Relation Age of Onset  . Diabetes Father   . Diabetes Paternal Grandmother     Social History   Social History  . Marital status: Single    Spouse name: N/A  . Number of children: N/A  . Years of education: N/A   Occupational History  . Not on file.   Social History Main Topics  . Smoking status: Never Smoker  . Smokeless tobacco: Never Used  . Alcohol use No  . Drug use: No  . Sexual activity: Yes   Other Topics Concern  . Not on file   Social History Narrative  . No narrative on file    No Known Allergies  Outpatient Medications Prior to Visit  Medication Sig Dispense Refill  . aspirin 81 MG tablet Take 1 tablet (81 mg total) by mouth daily. 30 tablet 11  . ferrous sulfate 325 (65 FE) MG tablet Take 325 mg by mouth daily with breakfast. otc    . glipiZIDE (GLUCOTROL) 10 MG tablet TAKE ONE TABLET BY MOUTH TWICE DAILY BEFORE MEAL(S) 180 tablet 1  . losartan-hydrochlorothiazide (HYZAAR) 100-12.5 MG tablet TAKE ONE TABLET BY MOUTH ONCE DAILY 90 tablet 0  . metFORMIN (GLUCOPHAGE XR) 500 MG 24 hr tablet Take 1 tablet (500 mg total) by mouth daily with breakfast. 90 tablet 1   No facility-administered medications prior to visit.     Review of Systems  Constitutional: Positive for malaise/fatigue. Negative for chills, fever and weight loss.  HENT: Positive for rhinorrhea and sinus pressure. Negative for ear discharge, ear pain, hearing  loss, sore throat and tinnitus.   Eyes: Positive for photophobia. Negative for blurred vision, pain and redness.  Respiratory: Negative for cough, sputum production, shortness of breath and wheezing.   Cardiovascular: Negative for chest pain, palpitations and leg swelling.  Gastrointestinal: Positive for nausea. Negative for abdominal pain, anorexia, blood in stool, constipation, diarrhea, heartburn, melena and vomiting.  Genitourinary: Negative for dysuria, frequency, hematuria and urgency.  Musculoskeletal: Positive for neck  pain. Negative for back pain, joint pain and myalgias.  Skin: Negative for rash.  Neurological: Positive for dizziness, headaches and loss of balance. Negative for tingling, sensory change, speech change, focal weakness, seizures, weakness and numbness.       Difficulty completing sentences/ "other week"  Endo/Heme/Allergies: Negative for environmental allergies and polydipsia. Does not bruise/bleed easily.  Psychiatric/Behavioral: Negative for depression and suicidal ideas. The patient is not nervous/anxious and does not have insomnia.      Objective  Vitals:   12/06/16 1420  BP: 138/80  Pulse: 92  SpO2: 100%  Weight: (!) 321 lb (145.6 kg)  Height: 5\' 7"  (1.702 m)    Physical Exam  Constitutional: She is well-developed, well-nourished, and in no distress. No distress.  HENT:  Head: Normocephalic and atraumatic.  Right Ear: External ear normal.  Left Ear: External ear normal.  Nose: Right sinus exhibits frontal sinus tenderness. Right sinus exhibits no maxillary sinus tenderness. Left sinus exhibits no maxillary sinus tenderness and no frontal sinus tenderness.  Mouth/Throat: Oropharynx is clear and moist. No oropharyngeal exudate, posterior oropharyngeal edema or posterior oropharyngeal erythema.  Eyes: Conjunctivae and EOM are normal. Pupils are equal, round, and reactive to light. Right eye exhibits no discharge. Left eye exhibits no discharge.  Fundoscopic exam:      The right eye shows no arteriolar narrowing, no AV nicking and no papilledema.       The left eye shows no arteriolar narrowing, no AV nicking and no papilledema.  Neck: Normal range of motion. Neck supple. No JVD present. No thyromegaly present.  Cardiovascular: Normal rate, regular rhythm, S1 normal, S2 normal, normal heart sounds and intact distal pulses.  Exam reveals no gallop, no S3, no S4 and no friction rub.   No murmur heard. Pulmonary/Chest: Effort normal and breath sounds normal. She has no wheezes. She  has no rales.  Abdominal: Soft. Bowel sounds are normal. She exhibits no mass. There is no tenderness. There is no guarding.  Musculoskeletal: Normal range of motion. She exhibits no edema.  Lymphadenopathy:    She has no cervical adenopathy.  Neurological: She is alert. She has normal sensation, normal strength, normal reflexes and intact cranial nerves.  Skin: Skin is warm and dry. She is not diaphoretic.  Psychiatric: Mood and affect normal.      Assessment & Plan  Problem List Items Addressed This Visit    None    Visit Diagnoses    Chronic tension-type headache, not intractable    -  Primary   Relevant Medications   fluticasone (FLONASE) 50 MCG/ACT nasal spray   Other Relevant Orders   CT Head W Contrast   CT Head Wo Contrast   DG Sinuses Complete (Completed)   Renal Function Panel      Meds ordered this encounter  Medications  . fluticasone (FLONASE) 50 MCG/ACT nasal spray    Sig: Place 2 sprays into both nostrils daily.    Dispense:  16 g    Refill:  6   Pt has been instructed to go to  ED if headache does not seem to get any better   Dr. Elizabeth Sauer [facility]  12/06/16

## 2016-12-06 NOTE — Progress Notes (Signed)
Patient here for follow up. She has a H/A today.

## 2016-12-06 NOTE — Patient Instructions (Signed)
Analgesic Rebound Headache An analgesic rebound headache, sometimes called a medication overuse headache, is a headache that comes after pain medicine (analgesic) taken to treat the original (primary) headache has worn off. Any type of primary headache can return as a rebound headache if a person regularly takes analgesics more than three times a week to treat it. The types of primary headaches that are commonly associated with rebound headaches include:  Migraines.  Headaches that arise from tense muscles in the head and neck area (tension headaches).  Headaches that develop and happen again (recur) on one side of the head and around the eye (cluster headaches).  If rebound headaches continue, they become chronic daily headaches. What are the causes? This condition may be caused by frequent use of:  Over-the-counter medicines such as aspirin, ibuprofen, and acetaminophen.  Sinus relief medicines and other medicines that contain caffeine.  Narcotic pain medicines such as codeine and oxycodone.  What are the signs or symptoms? The symptoms of a rebound headache are the same as the symptoms of the original headache. Some of the symptoms of specific types of headaches include: Migraine headache  Pulsing or throbbing pain on one or both sides of the head.  Severe pain that interferes with daily activities.  Pain that is worsened by physical activity.  Nausea, vomiting, or both.  Pain with exposure to bright light, loud noises, or strong smells.  General sensitivity to bright light, loud noises, or strong smells.  Visual changes.  Numbness of one or both arms. Tension headache  Pressure around the head.  Dull, aching head pain.  Pain felt over the front and sides of the head.  Tenderness in the muscles of the head, neck, and shoulders. Cluster headache  Severe pain that begins in or around one eye or temple.  Redness and tearing in the eye on the same side as the  pain.  Droopy or swollen eyelid.  One-sided head pain.  Nausea.  Runny nose.  Sweaty, pale facial skin.  Restlessness. How is this diagnosed? This condition is diagnosed by:  Reviewing your medical history. This includes the nature of your primary headaches.  Reviewing the types of pain medicines that you have been using to treat your headaches and how often you take them.  How is this treated? This condition may be treated or managed by:  Discontinuing frequent use of the analgesic medicine. Doing this may worsen your headaches at first, but the pain should eventually become more manageable, less frequent, and less severe.  Seeing a headache specialist. He or she may be able to help you manage your headaches and help make sure there is not another cause of the headaches.  Using methods of stress relief, such as acupuncture, counseling, biofeedback, and massage. Talk with your health care provider about which methods might be good for you.  Follow these instructions at home:  Take over-the-counter and prescription medicines only as told by your health care provider.  Stop the repeated use of pain medicine as told by your health care provider. Stopping can be difficult. Carefully follow instructions from your health care provider.  Avoid triggers that are known to cause your primary headaches.  Keep all follow-up visits as told by your health care provider. This is important. Contact a health care provider if:  You continue to experience headaches after following treatments that your health care provider recommended. Get help right away if:  You develop new headache pain.  You develop headache pain that is different   than what you have experienced in the past.  You develop numbness or tingling in your arms or legs.  You develop changes in your speech or vision. This information is not intended to replace advice given to you by your health care provider. Make sure you  discuss any questions you have with your health care provider. Document Released: 08/31/2003 Document Revised: 12/29/2015 Document Reviewed: 11/13/2015 Elsevier Interactive Patient Education  2018 [employer]vier Inc[employer]  

## 2016-12-07 LAB — RENAL FUNCTION PANEL
Albumin: 4.2 g/dL (ref 3.5–5.5)
BUN/Creatinine Ratio: 26 — ABNORMAL HIGH (ref 9–23)
BUN: 18 mg/dL (ref 6–20)
CO2: 26 mmol/L (ref 20–29)
Calcium: 9.5 mg/dL (ref 8.7–10.2)
Chloride: 97 mmol/L (ref 96–106)
Creatinine, Ser: 0.68 mg/dL (ref 0.57–1.00)
GFR calc Af Amer: 128 mL/min/{1.73_m2} (ref >59)
GFR calc non Af Amer: 111 mL/min/{1.73_m2} (ref >59)
Glucose: 123 mg/dL — ABNORMAL HIGH (ref 65–99)
Phosphorus: 4.1 mg/dL (ref 2.5–4.5)
Potassium: 4.6 mmol/L (ref 3.5–5.2)
Sodium: 138 mmol/L (ref 134–144)

## 2016-12-12 DIAGNOSIS — E1165 Type 2 diabetes mellitus with hyperglycemia: Secondary | ICD-10-CM | POA: Diagnosis not present

## 2016-12-19 DIAGNOSIS — E1165 Type 2 diabetes mellitus with hyperglycemia: Secondary | ICD-10-CM | POA: Diagnosis not present

## 2017-01-23 DIAGNOSIS — J209 Acute bronchitis, unspecified: Secondary | ICD-10-CM | POA: Diagnosis not present

## 2017-03-14 ENCOUNTER — Other Ambulatory Visit: Payer: Self-pay | Admitting: Family Medicine

## 2017-03-14 DIAGNOSIS — I10 Essential (primary) hypertension: Secondary | ICD-10-CM

## 2017-03-20 DIAGNOSIS — E1165 Type 2 diabetes mellitus with hyperglycemia: Secondary | ICD-10-CM | POA: Diagnosis not present

## 2017-03-26 DIAGNOSIS — E11649 Type 2 diabetes mellitus with hypoglycemia without coma: Secondary | ICD-10-CM | POA: Diagnosis not present

## 2017-06-10 ENCOUNTER — Ambulatory Visit: Payer: BLUE CROSS/BLUE SHIELD | Admitting: Podiatry

## 2017-06-12 DIAGNOSIS — J069 Acute upper respiratory infection, unspecified: Secondary | ICD-10-CM | POA: Diagnosis not present

## 2017-06-19 ENCOUNTER — Encounter: Payer: Self-pay | Admitting: Podiatry

## 2017-06-19 ENCOUNTER — Ambulatory Visit (INDEPENDENT_AMBULATORY_CARE_PROVIDER_SITE_OTHER): Payer: BLUE CROSS/BLUE SHIELD | Admitting: Podiatry

## 2017-06-19 DIAGNOSIS — E119 Type 2 diabetes mellitus without complications: Secondary | ICD-10-CM | POA: Diagnosis not present

## 2017-06-19 DIAGNOSIS — T148XXA Other injury of unspecified body region, initial encounter: Secondary | ICD-10-CM | POA: Diagnosis not present

## 2017-06-19 NOTE — Progress Notes (Signed)
   Subjective:    Patient ID: Jeanette Flores, female    DOB: 1975-07-15, 41 y.o.   MRN: [id number]  HPIthis patient presents the office for evaluation of a blackened toenail, second toe left foot.  This patient is a diabetic and she was instructed by a nurse at church to have this nail examined.  This patient has  a history of having injured the toenail previously and pain has been present for about 2-3 months.  She denies any redness or drainage or any treatment to the nail.she presents the office today for an evaluation and treatment of this second toenail left foot.    Review of Systems     Objective:   Physical Exam General Appearance  Alert, conversant and in no acute stress.  Vascular  Dorsalis pedis and posterior pulses are palpable  bilaterally.  Capillary return is within normal limits  bilaterally. Temperature is within normal limits  Bilaterally.  Neurologic  Senn-Weinstein monofilament wire test within normal limits  bilaterally. Muscle power within normal limits bilaterally.  Nails subungual hematoma noted under the nail plate second toe left foot.  This nail has marked incurvation and appears to be developing into a pincer toenail.   Orthopedic  No limitations of motion of motion feet bilaterally.  No crepitus or effusions noted.  No bony pathology or digital deformities noted.  Skin  normotropic skin with no porokeratosis noted bilaterally.  No signs of infections or ulcers noted.          Assessment & Plan:  S/P subungual hematoma second toe left foot.   IE  Discussed this condition with this patient.  Told her to continue to watch in the future but no pathology noted today.   Helane GuntherGregory Albany Flores DPM

## 2017-06-30 ENCOUNTER — Encounter: Payer: Self-pay | Admitting: Family Medicine

## 2017-06-30 ENCOUNTER — Ambulatory Visit (INDEPENDENT_AMBULATORY_CARE_PROVIDER_SITE_OTHER): Payer: BLUE CROSS/BLUE SHIELD | Admitting: Family Medicine

## 2017-06-30 VITALS — BP 120/80 | HR 80 | Ht 67.0 in | Wt 302.0 lb

## 2017-06-30 DIAGNOSIS — J219 Acute bronchiolitis, unspecified: Secondary | ICD-10-CM | POA: Diagnosis not present

## 2017-06-30 MED ORDER — GUAIFENESIN-CODEINE 100-10 MG/5ML PO SYRP: 5.0000 mL | ORAL_SOLUTION | Freq: Three times a day (TID) | ORAL | 0 refills | Status: DC | PRN

## 2017-06-30 MED ORDER — AZITHROMYCIN 250 MG PO TABS: ORAL_TABLET | ORAL | 0 refills | Status: DC

## 2017-06-30 NOTE — Progress Notes (Signed)
Name: Jeanette Flores   MRN: [id number]    DOB: 03/30/1976   Date:06/30/2017       Progress Note  Subjective  Chief Complaint  Chief Complaint  Patient presents with  . Cough    taking tessalon perles- not helping. Had an email visit on Dec 21st- no antibiotic given.    Cough  This is a new problem. The current episode started 1 to 4 weeks ago. The problem has been unchanged. The problem occurs constantly. The cough is productive of purulent sputum (yellowish). Associated symptoms include chest pain, nasal congestion, rhinorrhea and wheezing. Pertinent negatives include no chills, ear congestion, ear pain, fever, headaches, heartburn, hemoptysis, myalgias, postnasal drip, rash, sore throat, shortness of breath, sweats or weight loss. The symptoms are aggravated by lying down. Treatments tried: tessalon perles/mucinex. The treatment provided mild relief. There is no history of asthma, COPD, environmental allergies or pneumonia.    No problem-specific Assessment & Plan notes found for this encounter.   Past Medical History:  Diagnosis Date  . Anemia   . Hypertension     Past Surgical History:  Procedure Laterality Date  . Gastric bypass surgery      Family History  Problem Relation Age of Onset  . Diabetes Father   . Diabetes Paternal Grandmother     Social History   Socioeconomic History  . Marital status: Single    Spouse name: Not on file  . Number of children: Not on file  . Years of education: Not on file  . Highest education level: Not on file  Social Needs  . Financial resource strain: Not on file  . Food insecurity - worry: Not on file  . Food insecurity - inability: Not on file  . Transportation needs - medical: Not on file  . Transportation needs - non-medical: Not on file  Occupational History  . Not on file  Tobacco Use  . Smoking status: Never Smoker  . Smokeless tobacco: Never Used  Substance and Sexual Activity  . Alcohol use: No    Alcohol/week: 0.0  oz  . Drug use: No  . Sexual activity: Yes  Other Topics Concern  . Not on file  Social History Narrative  . Not on file    No Known Allergies  Outpatient Medications Prior to Visit  Medication Sig Dispense Refill  . aspirin 81 MG tablet Take 1 tablet (81 mg total) by mouth daily. 30 tablet 11  . benzonatate (TESSALON) 100 MG capsule   0  . ferrous sulfate 325 (65 FE) MG tablet Take 325 mg by mouth daily with breakfast. otc    . fluticasone (FLONASE) 50 MCG/ACT nasal spray Place 2 sprays into both nostrils daily. 16 g 6  . glipiZIDE (GLUCOTROL) 10 MG tablet TAKE ONE TABLET BY MOUTH TWICE DAILY BEFORE MEAL(S) (Patient taking differently: Take 10 mg by mouth daily before breakfast. Dr Tedd Sias) 180 tablet 1  . losartan-hydrochlorothiazide (HYZAAR) 100-12.5 MG tablet TAKE 1 TABLET BY MOUTH ONCE DAILY 90 tablet 0  . metFORMIN (GLUCOPHAGE XR) 500 MG 24 hr tablet Take 1 tablet (500 mg total) by mouth daily with breakfast. (Patient taking differently: Take 1,500 mg by mouth at bedtime. Dr Tedd Sias) 90 tablet 1  . OZEMPIC 0.25 or 0.5 MG/DOSE SOPN Inject 0.5 mg as directed once a week. Dr Tedd Sias  1   No facility-administered medications prior to visit.     Review of Systems  Constitutional: Negative for chills, fever, malaise/fatigue and weight loss.  HENT: Positive  for rhinorrhea. Negative for ear discharge, ear pain, postnasal drip and sore throat.   Eyes: Negative for blurred vision.  Respiratory: Positive for cough and wheezing. Negative for hemoptysis, sputum production and shortness of breath.   Cardiovascular: Positive for chest pain. Negative for palpitations and leg swelling.  Gastrointestinal: Negative for abdominal pain, blood in stool, constipation, diarrhea, heartburn, melena and nausea.  Genitourinary: Negative for dysuria, frequency, hematuria and urgency.  Musculoskeletal: Negative for back pain, joint pain, myalgias and neck pain.  Skin: Negative for rash.  Neurological: Negative  for dizziness, tingling, sensory change, focal weakness and headaches.  Endo/Heme/Allergies: Negative for environmental allergies and polydipsia. Does not bruise/bleed easily.  Psychiatric/Behavioral: Negative for depression and suicidal ideas. The patient is not nervous/anxious and does not have insomnia.      Objective  Vitals:   06/30/17 1353  BP: 120/80  Pulse: 80  Weight: (!) 302 lb (137 kg)  Height: 5\' 7"  (1.702 m)    Physical Exam  Constitutional: She is well-developed, well-nourished, and in no distress. No distress.  HENT:  Head: Normocephalic and atraumatic.  Right Ear: External ear normal.  Left Ear: External ear normal.  Nose: Nose normal.  Mouth/Throat: Oropharynx is clear and moist.  Eyes: Conjunctivae and EOM are normal. Pupils are equal, round, and reactive to light. Right eye exhibits no discharge. Left eye exhibits no discharge.  Neck: Normal range of motion. Neck supple. No JVD present. No thyromegaly present.  Cardiovascular: Normal rate, regular rhythm, normal heart sounds and intact distal pulses. Exam reveals no gallop and no friction rub.  No murmur heard. Pulmonary/Chest: Effort normal and breath sounds normal. She has no wheezes. She has no rales.  Abdominal: Soft. Bowel sounds are normal.  Musculoskeletal: Normal range of motion. She exhibits no edema.  Lymphadenopathy:    She has no cervical adenopathy.  Neurological: She is alert.  Skin: Skin is warm and dry. No rash noted. She is not diaphoretic.  Psychiatric: Mood and affect normal.  Nursing note and vitals reviewed.     Assessment & Plan  Problem List Items Addressed This Visit    None    Visit Diagnoses    Bronchiolitis    -  Primary   Relevant Medications   guaiFENesin-codeine (ROBITUSSIN AC) 100-10 MG/5ML syrup   azithromycin (ZITHROMAX) 250 MG tablet      Meds ordered this encounter  Medications  . guaiFENesin-codeine (ROBITUSSIN AC) 100-10 MG/5ML syrup    Sig: Take 5 mLs by  mouth 3 (three) times daily as needed for cough.    Dispense:  100 mL    Refill:  0  . azithromycin (ZITHROMAX) 250 MG tablet    Sig: 2 today then 1 a day for 4 days    Dispense:  6 tablet    Refill:  0      Dr. Hayden Rasmusseneanna Momina [facility]  06/30/17

## 2017-07-02 ENCOUNTER — Encounter: Payer: Self-pay | Admitting: Family Medicine

## 2017-07-02 ENCOUNTER — Ambulatory Visit (INDEPENDENT_AMBULATORY_CARE_PROVIDER_SITE_OTHER): Payer: BLUE CROSS/BLUE SHIELD | Admitting: Family Medicine

## 2017-07-02 VITALS — BP 102/70 | HR 72 | Ht 67.0 in | Wt 301.0 lb

## 2017-07-02 DIAGNOSIS — E785 Hyperlipidemia, unspecified: Secondary | ICD-10-CM | POA: Diagnosis not present

## 2017-07-02 DIAGNOSIS — E1159 Type 2 diabetes mellitus with other circulatory complications: Secondary | ICD-10-CM | POA: Diagnosis not present

## 2017-07-02 DIAGNOSIS — E1165 Type 2 diabetes mellitus with hyperglycemia: Secondary | ICD-10-CM | POA: Diagnosis not present

## 2017-07-02 DIAGNOSIS — Z23 Encounter for immunization: Secondary | ICD-10-CM

## 2017-07-02 DIAGNOSIS — I10 Essential (primary) hypertension: Secondary | ICD-10-CM | POA: Diagnosis not present

## 2017-07-02 DIAGNOSIS — I152 Hypertension secondary to endocrine disorders: Secondary | ICD-10-CM

## 2017-07-02 MED ORDER — LOSARTAN POTASSIUM-HCTZ 100-12.5 MG PO TABS: 1.0000 | ORAL_TABLET | Freq: Every day | ORAL | 3 refills | Status: DC

## 2017-07-02 NOTE — Progress Notes (Signed)
Name: Jeanette Flores   MRN: [id number]    DOB: 1976-05-15   Date:07/02/2017       Progress Note  Subjective  Chief Complaint  Chief Complaint  Patient presents with  . Hypertension    Hypertension  This is a chronic problem. The current episode started more than 1 year ago. The problem is unchanged. The problem is controlled. Pertinent negatives include no anxiety, blurred vision, chest pain, headaches, malaise/fatigue, neck pain, orthopnea, palpitations, peripheral edema, PND, shortness of breath or sweats. There are no associated agents to hypertension. Risk factors for coronary artery disease include diabetes mellitus, dyslipidemia and obesity. Past treatments include angiotensin blockers and diuretics. The current treatment provides moderate improvement. There is no history of angina, kidney disease, CAD/MI, CVA, heart failure, left ventricular hypertrophy, PVD or retinopathy. There is no history of chronic renal disease, a hypertension causing med or renovascular disease.    No problem-specific Assessment & Plan notes found for this encounter.   Past Medical History:  Diagnosis Date  . Anemia   . Hypertension     Past Surgical History:  Procedure Laterality Date  . Gastric bypass surgery      Family History  Problem Relation Age of Onset  . Diabetes Father   . Diabetes Paternal Grandmother     Social History   Socioeconomic History  . Marital status: Single    Spouse name: Not on file  . Number of children: Not on file  . Years of education: Not on file  . Highest education level: Not on file  Social Needs  . Financial resource strain: Not on file  . Food insecurity - worry: Not on file  . Food insecurity - inability: Not on file  . Transportation needs - medical: Not on file  . Transportation needs - non-medical: Not on file  Occupational History  . Not on file  Tobacco Use  . Smoking status: Never Smoker  . Smokeless tobacco: Never Used  Substance and Sexual  Activity  . Alcohol use: No    Alcohol/week: 0.0 oz  . Drug use: No  . Sexual activity: Yes  Other Topics Concern  . Not on file  Social History Narrative  . Not on file    No Known Allergies  Outpatient Medications Prior to Visit  Medication Sig Dispense Refill  . aspirin 81 MG tablet Take 1 tablet (81 mg total) by mouth daily. 30 tablet 11  . azithromycin (ZITHROMAX) 250 MG tablet 2 today then 1 a day for 4 days 6 tablet 0  . benzonatate (TESSALON) 100 MG capsule   0  . ferrous sulfate 325 (65 FE) MG tablet Take 325 mg by mouth daily with breakfast. otc    . fluticasone (FLONASE) 50 MCG/ACT nasal spray Place 2 sprays into both nostrils daily. 16 g 6  . glipiZIDE (GLUCOTROL) 10 MG tablet TAKE ONE TABLET BY MOUTH TWICE DAILY BEFORE MEAL(S) (Patient taking differently: Take 10 mg by mouth daily before breakfast. Dr Tedd SiasSolum) 180 tablet 1  . guaiFENesin-codeine (ROBITUSSIN AC) 100-10 MG/5ML syrup Take 5 mLs by mouth 3 (three) times daily as needed for cough. 100 mL 0  . metFORMIN (GLUCOPHAGE XR) 500 MG 24 hr tablet Take 1 tablet (500 mg total) by mouth daily with breakfast. (Patient taking differently: Take 1,500 mg by mouth at bedtime. Dr Tedd SiasSolum) 90 tablet 1  . OZEMPIC 0.25 or 0.5 MG/DOSE SOPN Inject 0.5 mg as directed once a week. Dr Tedd SiasSolum  1  . losartan-hydrochlorothiazide [facility](HYZAAR)  100-12.5 MG tablet TAKE 1 TABLET BY MOUTH ONCE DAILY 90 tablet 0   No facility-administered medications prior to visit.     Review of Systems  Constitutional: Negative for chills, fever, malaise/fatigue and weight loss.  HENT: Negative for ear discharge, ear pain and sore throat.   Eyes: Negative for blurred vision.  Respiratory: Negative for cough, sputum production, shortness of breath and wheezing.   Cardiovascular: Negative for chest pain, palpitations, orthopnea, leg swelling and PND.  Gastrointestinal: Negative for abdominal pain, blood in stool, constipation, diarrhea, heartburn, melena and nausea.   Genitourinary: Negative for dysuria, frequency, hematuria and urgency.  Musculoskeletal: Negative for back pain, joint pain, myalgias and neck pain.  Skin: Negative for rash.  Neurological: Negative for dizziness, tingling, sensory change, focal weakness and headaches.  Endo/Heme/Allergies: Negative for environmental allergies and polydipsia. Does not bruise/bleed easily.  Psychiatric/Behavioral: Negative for depression and suicidal ideas. The patient is not nervous/anxious and does not have insomnia.      Objective  Vitals:   07/02/17 1030  BP: 102/70  Pulse: 72  Weight: (!) 301 lb (136.5 kg)  Height: 5\' 7"  (1.702 m)    Physical Exam  Constitutional: She is well-developed, well-nourished, and in no distress. No distress.  HENT:  Head: Normocephalic and atraumatic.  Right Ear: External ear normal.  Left Ear: External ear normal.  Nose: Nose normal.  Mouth/Throat: Oropharynx is clear and moist.  Eyes: Conjunctivae and EOM are normal. Pupils are equal, round, and reactive to light. Right eye exhibits no discharge. Left eye exhibits no discharge.  Neck: Normal range of motion. Neck supple. No JVD present. No thyromegaly present.  Cardiovascular: Normal rate, regular rhythm, normal heart sounds and intact distal pulses. Exam reveals no gallop and no friction rub.  No murmur heard. Pulmonary/Chest: Effort normal and breath sounds normal. She has no wheezes. She has no rales.  Abdominal: Soft. Bowel sounds are normal. She exhibits no mass. There is no tenderness. There is no guarding.  Musculoskeletal: Normal range of motion. She exhibits no edema.  Lymphadenopathy:    She has no cervical adenopathy.  Neurological: She is alert. She has normal reflexes.  Skin: Skin is warm and dry. She is not diaphoretic.  Psychiatric: Mood and affect normal.  Nursing note and vitals reviewed.     Assessment & Plan  Problem List Items Addressed This Visit      Cardiovascular and  Mediastinum   Essential hypertension - Primary   Relevant Medications   losartan-hydrochlorothiazide (HYZAAR) 100-12.5 MG tablet   Other Relevant Orders   Renal function panel   Hypertension associated with diabetes (HCC)   Relevant Medications   losartan-hydrochlorothiazide (HYZAAR) 100-12.5 MG tablet   Other Relevant Orders   Lipid panel     Other   Morbid obesity (HCC)   Dyslipidemia   Relevant Orders   Lipid panel    Other Visit Diagnoses    Need for 23-polyvalent pneumococcal polysaccharide vaccine       Relevant Orders   Pneumococcal polysaccharide vaccine 23-valent greater than or equal to 2yo subcutaneous/IM (Completed)      Meds ordered this encounter  Medications  . losartan-hydrochlorothiazide (HYZAAR) 100-12.5 MG tablet    Sig: Take 1 tablet by mouth daily.    Dispense:  90 tablet    Refill:  3      Dr. Elizabeth Sauer [facility]  07/02/17

## 2017-07-03 DIAGNOSIS — E1165 Type 2 diabetes mellitus with hyperglycemia: Secondary | ICD-10-CM | POA: Diagnosis not present

## 2017-07-03 LAB — RENAL FUNCTION PANEL
Albumin: 4.2 g/dL (ref 3.5–5.5)
BUN/Creatinine Ratio: 16 (ref 9–23)
BUN: 10 mg/dL (ref 6–20)
CO2: 23 mmol/L (ref 20–29)
Calcium: 9.6 mg/dL (ref 8.7–10.2)
Chloride: 97 mmol/L (ref 96–106)
Creatinine, Ser: 0.64 mg/dL (ref 0.57–1.00)
GFR calc Af Amer: 130 mL/min/{1.73_m2} (ref >59)
GFR calc non Af Amer: 113 mL/min/{1.73_m2} (ref >59)
Glucose: 62 mg/dL — ABNORMAL LOW (ref 65–99)
Phosphorus: 3.7 mg/dL (ref 2.5–4.5)
Potassium: 4.9 mmol/L (ref 3.5–5.2)
Sodium: 137 mmol/L (ref 134–144)

## 2017-07-03 LAB — LIPID PANEL
Chol/HDL Ratio: 5.7 ratio — ABNORMAL HIGH (ref 0.0–4.4)
Cholesterol, Total: 189 mg/dL (ref 100–199)
HDL: 33 mg/dL — ABNORMAL LOW (ref >39)
LDL Calculated: 133 mg/dL — ABNORMAL HIGH (ref 0–99)
Triglycerides: 115 mg/dL (ref 0–149)
VLDL Cholesterol Cal: 23 mg/dL (ref 5–40)

## 2017-07-04 ENCOUNTER — Encounter: Payer: Self-pay | Admitting: Family Medicine

## 2017-07-04 ENCOUNTER — Other Ambulatory Visit: Payer: Self-pay

## 2017-09-30 DIAGNOSIS — H5211 Myopia, right eye: Secondary | ICD-10-CM | POA: Diagnosis not present

## 2017-10-01 DIAGNOSIS — E1165 Type 2 diabetes mellitus with hyperglycemia: Secondary | ICD-10-CM | POA: Diagnosis not present

## 2017-10-08 DIAGNOSIS — E1165 Type 2 diabetes mellitus with hyperglycemia: Secondary | ICD-10-CM | POA: Diagnosis not present

## 2017-10-30 LAB — HM DIABETES EYE EXAM

## 2017-11-07 ENCOUNTER — Other Ambulatory Visit: Payer: Self-pay

## 2018-01-02 DIAGNOSIS — E1165 Type 2 diabetes mellitus with hyperglycemia: Secondary | ICD-10-CM | POA: Diagnosis not present

## 2018-01-07 DIAGNOSIS — R062 Wheezing: Secondary | ICD-10-CM | POA: Diagnosis not present

## 2018-01-07 DIAGNOSIS — R05 Cough: Secondary | ICD-10-CM | POA: Diagnosis not present

## 2018-01-09 DIAGNOSIS — E1165 Type 2 diabetes mellitus with hyperglycemia: Secondary | ICD-10-CM | POA: Diagnosis not present

## 2018-04-06 DIAGNOSIS — E1165 Type 2 diabetes mellitus with hyperglycemia: Secondary | ICD-10-CM | POA: Diagnosis not present

## 2018-04-13 DIAGNOSIS — E119 Type 2 diabetes mellitus without complications: Secondary | ICD-10-CM | POA: Diagnosis not present

## 2018-06-05 DIAGNOSIS — L239 Allergic contact dermatitis, unspecified cause: Secondary | ICD-10-CM | POA: Diagnosis not present

## 2018-06-22 ENCOUNTER — Inpatient Hospital Stay: Payer: BLUE CROSS/BLUE SHIELD | Attending: Oncology

## 2018-06-22 ENCOUNTER — Other Ambulatory Visit: Payer: Self-pay | Admitting: *Deleted

## 2018-06-22 DIAGNOSIS — D509 Iron deficiency anemia, unspecified: Secondary | ICD-10-CM | POA: Diagnosis not present

## 2018-06-22 DIAGNOSIS — D649 Anemia, unspecified: Secondary | ICD-10-CM

## 2018-06-22 LAB — CBC WITH DIFFERENTIAL/PLATELET
Abs Immature Granulocytes: 0.03 10*3/uL (ref 0.00–0.07)
Basophils Absolute: 0.1 10*3/uL (ref 0.0–0.1)
Basophils Relative: 1 %
Eosinophils Absolute: 0.4 10*3/uL (ref 0.0–0.5)
Eosinophils Relative: 5 %
HCT: 36.4 % (ref 36.0–46.0)
Hemoglobin: 11.5 g/dL — ABNORMAL LOW (ref 12.0–15.0)
Immature Granulocytes: 0 %
Lymphocytes Relative: 30 %
Lymphs Abs: 2.4 10*3/uL (ref 0.7–4.0)
MCH: 24.4 pg — ABNORMAL LOW (ref 26.0–34.0)
MCHC: 31.6 g/dL (ref 30.0–36.0)
MCV: 77.1 fL — ABNORMAL LOW (ref 80.0–100.0)
Monocytes Absolute: 0.7 10*3/uL (ref 0.1–1.0)
Monocytes Relative: 9 %
Neutro Abs: 4.5 10*3/uL (ref 1.7–7.7)
Neutrophils Relative %: 55 %
Platelets: 339 10*3/uL (ref 150–400)
RBC: 4.72 MIL/uL (ref 3.87–5.11)
RDW: 17.1 % — ABNORMAL HIGH (ref 11.5–15.5)
WBC: 8.1 10*3/uL (ref 4.0–10.5)
nRBC: 0 % (ref 0.0–0.2)

## 2018-06-22 LAB — IRON AND TIBC
Iron: 38 ug/dL (ref 28–170)
Saturation Ratios: 9 % — ABNORMAL LOW (ref 10.4–31.8)
TIBC: 430 ug/dL (ref 250–450)
UIBC: 392 ug/dL

## 2018-06-22 LAB — FERRITIN: Ferritin: 22 ng/mL (ref 11–307)

## 2018-06-26 ENCOUNTER — Other Ambulatory Visit: Payer: Self-pay

## 2018-06-26 ENCOUNTER — Encounter: Payer: Self-pay | Admitting: Oncology

## 2018-06-26 ENCOUNTER — Inpatient Hospital Stay: Payer: BLUE CROSS/BLUE SHIELD | Attending: Oncology | Admitting: Oncology

## 2018-06-26 ENCOUNTER — Inpatient Hospital Stay: Payer: BLUE CROSS/BLUE SHIELD

## 2018-06-26 VITALS — BP 126/80 | HR 87 | Temp 98.1°F | Resp 18

## 2018-06-26 VITALS — BP 129/86 | HR 96 | Temp 98.1°F | Wt 293.2 lb

## 2018-06-26 DIAGNOSIS — Z7982 Long term (current) use of aspirin: Secondary | ICD-10-CM | POA: Diagnosis not present

## 2018-06-26 DIAGNOSIS — R5383 Other fatigue: Secondary | ICD-10-CM | POA: Diagnosis not present

## 2018-06-26 DIAGNOSIS — Z79899 Other long term (current) drug therapy: Secondary | ICD-10-CM | POA: Diagnosis not present

## 2018-06-26 DIAGNOSIS — Z7984 Long term (current) use of oral hypoglycemic drugs: Secondary | ICD-10-CM | POA: Insufficient documentation

## 2018-06-26 DIAGNOSIS — N920 Excessive and frequent menstruation with regular cycle: Secondary | ICD-10-CM | POA: Diagnosis not present

## 2018-06-26 DIAGNOSIS — R531 Weakness: Secondary | ICD-10-CM | POA: Diagnosis not present

## 2018-06-26 DIAGNOSIS — D509 Iron deficiency anemia, unspecified: Secondary | ICD-10-CM

## 2018-06-26 DIAGNOSIS — Z9884 Bariatric surgery status: Secondary | ICD-10-CM | POA: Insufficient documentation

## 2018-06-26 MED ORDER — SODIUM CHLORIDE 0.9 % IV SOLN
510.0000 mg | Freq: Once | INTRAVENOUS | Status: AC
  Administered 2018-06-26: 510 mg via INTRAVENOUS
  Filled 2018-06-26: qty 17

## 2018-06-26 MED ORDER — SODIUM CHLORIDE 0.9 % IV SOLN
Freq: Once | INTRAVENOUS | Status: AC
  Administered 2018-06-26: 14:00:00 via INTRAVENOUS
  Filled 2018-06-26: qty 250

## 2018-06-26 NOTE — Progress Notes (Signed)
[facility]  [phone] [phone]  ID: Jeanette Flores OB: [birth year]  MR#: [id number]  UYQ#:[id number]#:[id number]  Patient Care Team: [name], [name], MD as PCP - General (Family Medicine)  CHIEF COMPLAINT: Iron deficiency anemia  INTERVAL HISTORY: Patient was last evaluated in clinic in June 2018.  She has noted increasing weakness and fatigue over the past several weeks.  She otherwise feels well.  She continues to be active and work full-time.  She denies any neurologic symptoms. She denies any fevers, chills, or night sweats.  She denies any chest pain, shortness of breath, or cough.  She denies any nausea, vomiting, constipation, or diarrhea.  She has no melena or hematochezia.  She continues to have heavy menses.  She has no urinary complaints.  Patient offers no further specific complaints today.  REVIEW OF SYSTEMS:   Review of Systems  Constitutional: Positive for malaise/fatigue. Negative for fever and weight loss.  Respiratory: Negative.  Negative for cough and shortness of breath.   Cardiovascular: Negative.  Negative for chest pain and leg swelling.  Gastrointestinal: Negative.  Negative for abdominal pain, blood in stool and melena.  Genitourinary: Negative.  Negative for hematuria.  Musculoskeletal: Negative.   Skin: Negative.  Negative for rash.  Neurological: Positive for weakness. Negative for focal weakness and headaches.  Psychiatric/Behavioral: Negative.  The patient is not nervous/anxious.     As per HPI. Otherwise, a complete review of systems is negative.  PAST MEDICAL HISTORY: Past Medical History:  Diagnosis Date  . Anemia   . Hypertension     PAST SURGICAL HISTORY: Past Surgical History:  Procedure Laterality Date  . Gastric bypass surgery     FAMILY HISTORY: Colon and breast cancer. No history of clotting or bleeding disorder.      ADVANCED DIRECTIVES:    HEALTH MAINTENANCE: Social History   Tobacco Use  .  Smoking status: Never Smoker  . Smokeless tobacco: Never Used  Substance Use Topics  . Alcohol use: No    Alcohol/week: 0.0 standard drinks  . Drug use: No     Colonoscopy:  PAP:  Bone density:  Lipid panel:  Allergies  Allergen Reactions  . Azithromycin Hives    Current Outpatient Medications  Medication Sig Dispense Refill  . aspirin 81 MG tablet Take 1 tablet (81 mg total) by mouth daily. 30 tablet 11  . ferrous sulfate 325 (65 FE) MG tablet Take 325 mg by mouth daily with breakfast. otc    . fluticasone (FLONASE) 50 MCG/ACT nasal spray Place 2 sprays into both nostrils daily. 16 g 6  . losartan-hydrochlorothiazide (HYZAAR) 100-12.5 MG tablet Take 1 tablet by mouth daily. 90 tablet 3  . metFORMIN (GLUCOPHAGE XR) 500 MG 24 hr tablet Take 1 tablet (500 mg total) by mouth daily with breakfast. (Patient taking differently: Take 1,500 mg by mouth at bedtime. Dr Tedd SiasSolum) 90 tablet 1  . OZEMPIC 0.25 or 0.5 MG/DOSE SOPN Inject 0.5 mg as directed once a week. Dr Tedd SiasSolum  1   No current facility-administered medications for this visit.    Facility-Administered Medications Ordered in Other Visits  Medication Dose Route Frequency Provider Last Rate Last Dose  . 0.9 %  sodium chloride infusion   Intravenous Once Jeralyn RuthsFinnegan, Timothy J, MD      . ferumoxytol [facility](FERAHEME) 510 mg in sodium chloride 0.9 % 100 mL IVPB  510 mg Intravenous Once Jeralyn RuthsFinnegan, Timothy J, MD        OBJECTIVE: Vitals:   06/26/18 1413  BP: 129/86  Pulse: 96  Temp: 98.1 F (36.7 C)     Body mass index is 45.92 kg/m.    ECOG FS:0 - Asymptomatic  General: Well-developed, well-nourished, no acute distress. Eyes: Pink conjunctiva, anicteric sclera. HEENT: Normocephalic, moist mucous membranes. Lungs: Clear to auscultation bilaterally. Heart: Regular rate and rhythm. No rubs, murmurs, or gallops. Abdomen: Soft, nontender, nondistended. No organomegaly noted, normoactive bowel sounds. Musculoskeletal: No edema, cyanosis, or  clubbing. Neuro: Alert, answering all questions appropriately. Cranial nerves grossly intact. Skin: No rashes or petechiae noted. Psych: Normal affect.  LAB RESULTS:  Lab Results  Component Value Date   NA 137 07/02/2017   K 4.9 07/02/2017   CL 97 07/02/2017   CO2 23 07/02/2017   GLUCOSE 62 (L) 07/02/2017   BUN 10 07/02/2017   CREATININE 0.64 07/02/2017   CALCIUM 9.6 07/02/2017   ALBUMIN 4.2 07/02/2017   GFRNONAA 113 07/02/2017   GFRAA 130 07/02/2017    Lab Results  Component Value Date   WBC 8.1 06/22/2018   NEUTROABS 4.5 06/22/2018   HGB 11.5 (L) 06/22/2018   HCT 36.4 06/22/2018   MCV 77.1 (L) 06/22/2018   PLT 339 06/22/2018   Lab Results  Component Value Date   IRON 38 06/22/2018   TIBC 430 06/22/2018   IRONPCTSAT 9 (L) 06/22/2018    Lab Results  Component Value Date   FERRITIN 22 06/22/2018     STUDIES: No results found.  ASSESSMENT: Iron deficiency anemia.  PLAN:    1.  Iron deficiency anemia: Likely secondary to patient's ongoing heavy menses.  Patient's hemoglobin is mildly decreased and her iron stores have trended down.  She also is mildly symptomatic.  Proceed with 510 mg IV Feraheme today.  Patient does not require a second infusion.  Return to clinic in 6 months with repeat laboratory work and further evaluation.    I spent a total of 30 minutes face-to-face with the patient of which greater than 50% of the visit was spent in counseling and coordination of care as detailed above.  Patient expressed understanding and was in agreement with this plan. She also understands that She can call clinic at any time with any questions, concerns, or complaints.     Jeralyn Ruths, MD   06/26/2018 2:24 PM

## 2018-06-26 NOTE — Patient Instructions (Signed)

## 2018-06-26 NOTE — Progress Notes (Signed)
Patient here today for follow up regarding anemia, reports fatigue today.

## 2018-07-10 DIAGNOSIS — E119 Type 2 diabetes mellitus without complications: Secondary | ICD-10-CM | POA: Diagnosis not present

## 2018-07-17 DIAGNOSIS — E1165 Type 2 diabetes mellitus with hyperglycemia: Secondary | ICD-10-CM | POA: Diagnosis not present

## 2018-08-23 DIAGNOSIS — E119 Type 2 diabetes mellitus without complications: Secondary | ICD-10-CM | POA: Diagnosis not present

## 2018-08-23 LAB — HM DIABETES EYE EXAM

## 2018-09-23 DIAGNOSIS — E119 Type 2 diabetes mellitus without complications: Secondary | ICD-10-CM | POA: Diagnosis not present

## 2018-10-12 DIAGNOSIS — E1165 Type 2 diabetes mellitus with hyperglycemia: Secondary | ICD-10-CM | POA: Diagnosis not present

## 2018-10-22 ENCOUNTER — Other Ambulatory Visit: Payer: Self-pay

## 2018-10-22 DIAGNOSIS — I10 Essential (primary) hypertension: Secondary | ICD-10-CM

## 2018-10-22 MED ORDER — LOSARTAN POTASSIUM-HCTZ 100-12.5 MG PO TABS: 1.0000 | ORAL_TABLET | Freq: Every day | ORAL | 0 refills | Status: DC

## 2018-10-23 DIAGNOSIS — E119 Type 2 diabetes mellitus without complications: Secondary | ICD-10-CM | POA: Diagnosis not present

## 2018-10-26 ENCOUNTER — Other Ambulatory Visit: Payer: Self-pay

## 2018-10-26 ENCOUNTER — Ambulatory Visit: Payer: BLUE CROSS/BLUE SHIELD | Admitting: Family Medicine

## 2018-10-26 ENCOUNTER — Encounter: Payer: Self-pay | Admitting: Family Medicine

## 2018-10-26 VITALS — BP 110/80 | Ht 67.0 in | Wt 296.0 lb

## 2018-10-26 DIAGNOSIS — E1159 Type 2 diabetes mellitus with other circulatory complications: Secondary | ICD-10-CM

## 2018-10-26 DIAGNOSIS — I152 Hypertension secondary to endocrine disorders: Secondary | ICD-10-CM

## 2018-10-26 DIAGNOSIS — I10 Essential (primary) hypertension: Secondary | ICD-10-CM

## 2018-10-26 MED ORDER — LOSARTAN POTASSIUM-HCTZ 100-12.5 MG PO TABS: 1.0000 | ORAL_TABLET | Freq: Every day | ORAL | 1 refills | Status: DC

## 2018-10-26 NOTE — Patient Instructions (Signed)

## 2018-10-26 NOTE — Progress Notes (Signed)
Date:  10/26/2018   Name:  Jeanette Flores   DOB:  [birth year]   MRN:  [id number]   Chief Complaint: Hypertension  Hypertension  This is a chronic problem. The current episode started more than 1 year ago. The problem is unchanged. The problem is controlled. Pertinent negatives include no anxiety, blurred vision, chest pain, headaches, malaise/fatigue, neck pain, orthopnea, palpitations, peripheral edema, PND, shortness of breath or sweats. There are no associated agents to hypertension. Risk factors for coronary artery disease include dyslipidemia, diabetes mellitus and obesity. Past treatments include angiotensin blockers and diuretics. The current treatment provides moderate improvement. There are no compliance problems.  There is no history of angina, kidney disease, CAD/MI, CVA, heart failure, left ventricular hypertrophy, PVD or retinopathy. There is no history of chronic renal disease, a hypertension causing med or renovascular disease.    Review of Systems  Constitutional: Negative.  Negative for chills, fatigue, fever, malaise/fatigue and unexpected weight change.  HENT: Negative for congestion, ear discharge, ear pain, rhinorrhea, sinus pressure, sneezing and sore throat.   Eyes: Negative for blurred vision, photophobia, pain, discharge, redness and itching.  Respiratory: Negative for cough, shortness of breath, wheezing and stridor.   Cardiovascular: Negative for chest pain, palpitations, orthopnea, leg swelling and PND.  Gastrointestinal: Negative for abdominal pain, blood in stool, constipation, diarrhea, nausea and vomiting.  Endocrine: Negative for cold intolerance, heat intolerance, polydipsia, polyphagia and polyuria.  Genitourinary: Negative for dysuria, flank pain, frequency, hematuria, menstrual problem, pelvic pain, urgency, vaginal bleeding and vaginal discharge.  Musculoskeletal: Negative for arthralgias, back pain, myalgias and neck pain.  Skin: Negative for rash.   Allergic/Immunologic: Negative for environmental allergies and food allergies.  Neurological: Negative for dizziness, weakness, light-headedness, numbness and headaches.  Hematological: Negative for adenopathy. Does not bruise/bleed easily.  Psychiatric/Behavioral: Negative for dysphoric mood. The patient is not nervous/anxious.     Patient Active Problem List   Diagnosis Date Noted  . Morbid obesity (HCC) 07/02/2017  . Hypertension associated with diabetes (HCC) 07/02/2017  . Dyslipidemia 07/02/2017  . Iron deficiency anemia 11/08/2015  . Essential hypertension 11/02/2015  . Type 2 diabetes mellitus without complication, without long-term current use of insulin (HCC) 11/02/2015  . Hyperlipidemia 11/02/2015  . Overweight 11/02/2015    Allergies  Allergen Reactions  . Azithromycin Hives    Past Surgical History:  Procedure Laterality Date  . Gastric bypass surgery      Social History   Tobacco Use  . Smoking status: Never Smoker  . Smokeless tobacco: Never Used  Substance Use Topics  . Alcohol use: No    Alcohol/week: 0.0 standard drinks  . Drug use: No     Medication list has been reviewed and updated.  Current Meds  Medication Sig  . aspirin 81 MG tablet Take 1 tablet (81 mg total) by mouth daily.  Marland Kitchen atorvastatin (LIPITOR) 20 MG tablet Take 1 tablet by mouth daily. Solum  . ferrous sulfate 325 (65 FE) MG tablet Take 325 mg by mouth daily with breakfast. otc  . losartan-hydrochlorothiazide (HYZAAR) 100-12.5 MG tablet Take 1 tablet by mouth daily.  . metFORMIN (GLUCOPHAGE XR) 500 MG 24 hr tablet Take 1 tablet (500 mg total) by mouth daily with breakfast. (Patient taking differently: Take 1,500 mg by mouth at bedtime. Dr Tedd Sias)  . OZEMPIC 0.25 or 0.5 MG/DOSE SOPN Inject 0.5 mg as directed once a week. Dr Tedd Sias    [facility] 2/9 Scores 10/26/2018 06/30/2017 11/02/2015  PHQ - 2 Score 0 0 0  PHQ- 9 Score 1 0 -    BP Readings from Last 3 Encounters:  10/26/18 110/80   06/26/18 126/80  06/26/18 129/86    Physical Exam Vitals signs and nursing note reviewed.  Constitutional:      General: She is not in acute distress.    Appearance: She is not diaphoretic.  HENT:     Head: Normocephalic and atraumatic.     Right Ear: External ear normal.     Left Ear: External ear normal.     Nose: Nose normal.  Eyes:     General:        Right eye: No discharge.        Left eye: No discharge.     Conjunctiva/sclera: Conjunctivae normal.     Pupils: Pupils are equal, round, and reactive to light.  Neck:     Musculoskeletal: Normal range of motion and neck supple.     Thyroid: No thyromegaly.     Vascular: No JVD.  Cardiovascular:     Rate and Rhythm: Normal rate and regular rhythm.     Chest Wall: PMI is not displaced. No thrill.     Pulses: Normal pulses.     Heart sounds: Murmur present. Systolic murmur present with a grade of 2/6. No diastolic murmur. No friction rub. No gallop. No S3 or S4 sounds.   Pulmonary:     Effort: Pulmonary effort is normal.     Breath sounds: Normal breath sounds. No wheezing, rhonchi or rales.  Chest:     Chest wall: No tenderness.  Abdominal:     General: Bowel sounds are normal.     Palpations: Abdomen is soft. There is no mass.     Tenderness: There is no abdominal tenderness. There is no guarding.  Musculoskeletal: Normal range of motion.     Right lower leg: No edema.     Left lower leg: No edema.  Lymphadenopathy:     Cervical: No cervical adenopathy.  Skin:    General: Skin is warm and dry.  Neurological:     Mental Status: She is alert.     Deep Tendon Reflexes: Reflexes are normal and symmetric.     Wt Readings from Last 3 Encounters:  10/26/18 296 lb (134.3 kg)  06/26/18 293 lb 3.4 oz (133 kg)  07/02/17 (!) 301 lb (136.5 kg)    BP 110/80   Ht 5\' 7"  (1.702 m)   Wt 296 lb (134.3 kg)   LMP 10/12/2018 (Approximate)   BMI 46.36 kg/m   Assessment and Plan: 1. Hypertension associated with diabetes  (HCC) Chronic.  Controlled pretension.  Diabetes followed by endocrinology.  Continue losartan hydrochlorothiazide 100-12 0.5 once a day.  I reviewed last metabolic panel per endocrinology on 10/12/2018. - losartan-hydrochlorothiazide (HYZAAR) 100-12.5 MG tablet; Take 1 tablet by mouth daily.  Dispense: 90 tablet; Refill: 1  2. Morbid obesity (HCC) Patient was given a diet to lose weight.Health risks of being over weight were discussed and patient was counseled on weight loss options and exercise. - atorvastatin (LIPITOR) 20 MG tablet; Take 1 tablet by mouth daily. Solum

## 2018-11-18 ENCOUNTER — Other Ambulatory Visit: Payer: Self-pay

## 2018-11-18 DIAGNOSIS — E1159 Type 2 diabetes mellitus with other circulatory complications: Secondary | ICD-10-CM

## 2018-11-18 DIAGNOSIS — I152 Hypertension secondary to endocrine disorders: Secondary | ICD-10-CM

## 2018-11-18 MED ORDER — LOSARTAN POTASSIUM-HCTZ 100-12.5 MG PO TABS: 1.0000 | ORAL_TABLET | Freq: Every day | ORAL | 1 refills | Status: DC

## 2018-11-23 DIAGNOSIS — E119 Type 2 diabetes mellitus without complications: Secondary | ICD-10-CM | POA: Diagnosis not present

## 2018-12-23 ENCOUNTER — Other Ambulatory Visit: Payer: Self-pay

## 2018-12-23 ENCOUNTER — Inpatient Hospital Stay: Payer: BC Managed Care – PPO | Attending: Hematology and Oncology

## 2018-12-23 DIAGNOSIS — D509 Iron deficiency anemia, unspecified: Secondary | ICD-10-CM | POA: Insufficient documentation

## 2018-12-23 DIAGNOSIS — Z9884 Bariatric surgery status: Secondary | ICD-10-CM | POA: Insufficient documentation

## 2018-12-23 DIAGNOSIS — R5383 Other fatigue: Secondary | ICD-10-CM | POA: Diagnosis not present

## 2018-12-23 DIAGNOSIS — Z79899 Other long term (current) drug therapy: Secondary | ICD-10-CM | POA: Insufficient documentation

## 2018-12-23 DIAGNOSIS — N920 Excessive and frequent menstruation with regular cycle: Secondary | ICD-10-CM | POA: Diagnosis not present

## 2018-12-23 DIAGNOSIS — Z7982 Long term (current) use of aspirin: Secondary | ICD-10-CM | POA: Diagnosis not present

## 2018-12-23 DIAGNOSIS — I10 Essential (primary) hypertension: Secondary | ICD-10-CM | POA: Insufficient documentation

## 2018-12-23 DIAGNOSIS — Z7984 Long term (current) use of oral hypoglycemic drugs: Secondary | ICD-10-CM | POA: Diagnosis not present

## 2018-12-23 DIAGNOSIS — E119 Type 2 diabetes mellitus without complications: Secondary | ICD-10-CM | POA: Diagnosis not present

## 2018-12-23 DIAGNOSIS — E538 Deficiency of other specified B group vitamins: Secondary | ICD-10-CM | POA: Diagnosis not present

## 2018-12-23 DIAGNOSIS — R531 Weakness: Secondary | ICD-10-CM | POA: Insufficient documentation

## 2018-12-23 DIAGNOSIS — R51 Headache: Secondary | ICD-10-CM | POA: Insufficient documentation

## 2018-12-23 DIAGNOSIS — D649 Anemia, unspecified: Secondary | ICD-10-CM

## 2018-12-23 LAB — CBC WITH DIFFERENTIAL/PLATELET
Abs Immature Granulocytes: 0.03 10*3/uL (ref 0.00–0.07)
Basophils Absolute: 0.1 10*3/uL (ref 0.0–0.1)
Basophils Relative: 1 %
Eosinophils Absolute: 0.4 10*3/uL (ref 0.0–0.5)
Eosinophils Relative: 5 %
HCT: 35.2 % — ABNORMAL LOW (ref 36.0–46.0)
Hemoglobin: 11.4 g/dL — ABNORMAL LOW (ref 12.0–15.0)
Immature Granulocytes: 0 %
Lymphocytes Relative: 19 %
Lymphs Abs: 1.7 10*3/uL (ref 0.7–4.0)
MCH: 25.6 pg — ABNORMAL LOW (ref 26.0–34.0)
MCHC: 32.4 g/dL (ref 30.0–36.0)
MCV: 79.1 fL — ABNORMAL LOW (ref 80.0–100.0)
Monocytes Absolute: 0.8 10*3/uL (ref 0.1–1.0)
Monocytes Relative: 9 %
Neutro Abs: 5.9 10*3/uL (ref 1.7–7.7)
Neutrophils Relative %: 66 %
Platelets: 322 10*3/uL (ref 150–400)
RBC: 4.45 MIL/uL (ref 3.87–5.11)
RDW: 15.5 % (ref 11.5–15.5)
WBC: 8.9 10*3/uL (ref 4.0–10.5)
nRBC: 0 % (ref 0.0–0.2)

## 2018-12-23 LAB — IRON AND TIBC
Iron: 47 ug/dL (ref 28–170)
Saturation Ratios: 10 % — ABNORMAL LOW (ref 10.4–31.8)
TIBC: 451 ug/dL — ABNORMAL HIGH (ref 250–450)
UIBC: 404 ug/dL

## 2018-12-23 LAB — FERRITIN: Ferritin: 18 ng/mL (ref 11–307)

## 2018-12-23 NOTE — Progress Notes (Signed)
[facility]  [address], [address] [place], [place] [address] Phone: [phone]  Fax: [phone]   Clinic Day:  12/24/2018  Referring physician: [name], [name], MD  Chief Complaint: Jeanette KelpMeishea Bordenave is a 43 y.o. female s/p gastric bypass (2008) with iron deficiency anemia who is seen for new patient assessment.   HPI:   She underwent gastric bypass in 2008.  She presented to hospital in 2015 with what she thought was a TIA.  Iron was low.  She was transfused with packed red blood cells (PRBCs).  After discharge, she began seeing Dr. Orlie DakinFinnegan.  She started on IV iron every 3 to 6 months, then every 6 months then once a year.  There was a gap in her follow-up in 2019.  She notes extremely heavy menses.  Menses last for 7 to 9 days.  She bleeds through a heavy pad and tampon.  She is extremely tired.  Her aunt and mother had heavy periods and underwent hysterectomy.  She states that her gynecologist has not put her on any medication.  No plans for surgery.  She comments that she was on birth control in high school that caused elevated blood pressure.  She received Feraheme on 11/10/2015.  Ferritin has been followed: 9 on 11/11/2011, 25 on 08/23/2013, 33 on 02/17/2015, 36 on 11/10/2015, 44 on 12/04/2016, 22 on 06/22/2018, 18 on 12/24/2018.   Labs on 12/23/2018: WBC 8,900, hemoglobin 11.4, hematocrit 35.2, MCV 79.1, platelets 322,000.  Ferritin was 18 with an iron saturation of 10% and a TIBC 451.  The patient was last seen in the hematology clinic by Dr. Orlie DakinFinnegan on 06/26/2018.  At that time, she noted increasing weakness and fatigue over the past several weeks. She continued to be active and work full-time. She had no melena or hematochezia. She had heavy menses. She received Feraheme.  Symptomatically, she is doing well. She is not on her menstrual cycle today. She has headaches.  She denies dizziness with menses. She denies bleeding during surgery or easy bruising.  She denies blood in her stool, hematuria, weight loss. She denies all other symptoms.  Her diet consists of plenty green leafy vegetables.  She consumes meat x 3 a week. She denies ice cravings. She is taking oral iron, and vitamin C, and oral B-12.  She denies any pica.   Past Medical History:  Diagnosis Date  . Anemia   . Hypertension     Past Surgical History:  Procedure Laterality Date  . Gastric bypass surgery      Family History  Problem Relation Age of Onset  . Diabetes Father   . Diabetes Paternal Grandmother     Social History:  reports that she has never smoked. She has never used smokeless tobacco. She reports that she does not drink alcohol or use drugs.She is married with no children. During COVID-19 she works from home for [employer] and [employer]; she is a Production designer, theatre/television/filmmanager. She denies any exposure to toxins.  [place] in [place].  The patient is alone today.  Allergies:  Allergies  Allergen Reactions  . Azithromycin Hives    Current Medications: Current Outpatient Medications  Medication Sig Dispense Refill  . aspirin 81 MG tablet Take 1 tablet (81 mg total) by mouth daily. 30 tablet 11  . atorvastatin (LIPITOR) 20 MG tablet Take 1 tablet by mouth daily. Solum    . ferrous sulfate 325 (65 FE) MG tablet Take 325 mg by mouth daily with breakfast. otc    .  losartan-hydrochlorothiazide (HYZAAR) 100-12.5 MG tablet Take 1 tablet by mouth daily. 90 tablet 1  . metFORMIN (GLUCOPHAGE XR) 500 MG 24 hr tablet Take 1 tablet (500 mg total) by mouth daily with breakfast. (Patient taking differently: Take 1,500 mg by mouth at bedtime. Dr Gabriel Carina) 90 tablet 1  . OZEMPIC 0.25 or 0.5 MG/DOSE SOPN Inject 0.5 mg as directed once a week. Dr Gabriel Carina  1  . fluticasone (FLONASE) 50 MCG/ACT nasal spray Place 2 sprays into both nostrils daily. (Patient not taking: Reported on 10/26/2018) 16 g 6   No current facility-administered medications for this visit.     Review of Systems   Constitutional: Positive for malaise/fatigue (during menses). Negative for chills, fever and weight loss.       Doing well.  HENT: Negative.  Negative for congestion, hearing loss, nosebleeds, sinus pain and sore throat.   Eyes: Negative.  Negative for blurred vision, double vision, photophobia and pain.  Respiratory: Negative for cough, hemoptysis and shortness of breath.   Cardiovascular: Negative.  Negative for chest pain, palpitations and leg swelling.  Gastrointestinal: Negative.  Negative for abdominal pain, blood in stool, constipation, diarrhea, heartburn, melena, nausea and vomiting.  Genitourinary: Negative for dysuria, hematuria and urgency.       Heavy menses.  Musculoskeletal: Negative.  Negative for back pain, joint pain and myalgias.  Skin: Negative.  Negative for rash.  Neurological: Positive for headaches. Negative for dizziness, tingling, sensory change and weakness.  Endo/Heme/Allergies: Negative.  Does not bruise/bleed easily.  Psychiatric/Behavioral: Negative.  Negative for depression and memory loss. The patient is not nervous/anxious and does not have insomnia.   All other systems reviewed and are negative.  Performance status (ECOG): 0  Vital Signs  Blood pressure 125/78, pulse (!) 101, temperature 98.2 F (36.8 C), temperature source Tympanic, resp. rate 18, height 5\' 7"  (1.702 m), weight 292 lb 8.8 oz (132.7 kg), SpO2 99 %.  Physical Exam  Constitutional: She is oriented to person, place, and time. She appears well-developed and well-nourished. No distress.  HENT:  Head: Normocephalic and atraumatic.  Mouth/Throat: Oropharynx is clear and moist. No oropharyngeal exudate.  Long black hair. Mask.  Eyes: Pupils are equal, round, and reactive to light. Conjunctivae and EOM are normal. No scleral icterus.  Brown eyes. Glasses.  Neck: Normal range of motion. Neck supple.  Cardiovascular: Normal rate, regular rhythm and normal heart sounds.  No murmur heard.  Pulmonary/Chest: Effort normal and breath sounds normal. No respiratory distress.  Abdominal: Soft. Bowel sounds are normal. There is no abdominal tenderness.  Musculoskeletal: Normal range of motion.        General: No tenderness or edema.  Lymphadenopathy:    She has no cervical adenopathy.    She has no axillary adenopathy.       Right: No supraclavicular adenopathy present.       Left: No supraclavicular adenopathy present.  Neurological: She is alert and oriented to person, place, and time. She has normal reflexes.  Skin: Skin is warm and dry. She is not diaphoretic.  Psychiatric: She has a normal mood and affect. Her behavior is normal. Judgment and thought content normal.  Nursing note and vitals reviewed.   Appointment on 12/23/2018  Component Date Value Ref Range Status  . Iron 12/23/2018 47  28 - 170 ug/dL Final  . TIBC 12/23/2018 451* 250 - 450 ug/dL Final  . Saturation Ratios 12/23/2018 10* 10.4 - 31.8 % Final  . UIBC 12/23/2018 404  ug/dL Final  Performed at [facility], [address] [place], [place] [address]  . Ferritin 12/23/2018 18  11 - 307 ng/mL Final   Performed at [facility], [address] [place], [place] [address]  . WBC 12/23/2018 8.9  4.0 - 10.5 K/uL Final  . RBC 12/23/2018 4.45  3.87 - 5.11 MIL/uL Final  . Hemoglobin 12/23/2018 11.4* 12.0 - 15.0 g/dL Final  . HCT 09/81/191407/06/2018 35.2* 36.0 - 46.0 % Final  . MCV 12/23/2018 79.1* 80.0 - 100.0 fL Final  . MCH 12/23/2018 25.6* 26.0 - 34.0 pg Final  . MCHC 12/23/2018 32.4  30.0 - 36.0 g/dL Final  . RDW 78/29/562107/06/2018 15.5  11.5 - 15.5 % Final  . Platelets 12/23/2018 322  150 - 400 K/uL Final  . nRBC 12/23/2018 0.0  0.0 - 0.2 % Final  . Neutrophils Relative % 12/23/2018 66  % Final  . Neutro Abs 12/23/2018 5.9  1.7 - 7.7 K/uL Final  . Lymphocytes Relative 12/23/2018 19  % Final  . Lymphs Abs 12/23/2018 1.7  0.7 - 4.0 K/uL Final  . Monocytes Relative 12/23/2018 9  % Final  . Monocytes  Absolute 12/23/2018 0.8  0.1 - 1.0 K/uL Final  . Eosinophils Relative 12/23/2018 5  % Final  . Eosinophils Absolute 12/23/2018 0.4  0.0 - 0.5 K/uL Final  . Basophils Relative 12/23/2018 1  % Final  . Basophils Absolute 12/23/2018 0.1  0.0 - 0.1 K/uL Final  . Immature Granulocytes 12/23/2018 0  % Final  . Abs Immature Granulocytes 12/23/2018 0.03  0.00 - 0.07 K/uL Final   Performed at [facility], [address] [place], [place] [address]    Assessment:  Jeanette Flores is a 43 y.o. female s/p gastric bypass surgery (2008) with iron deficiency anemia.  She has menorrhagia.  Diet is good.  She has no pica.  Labs on 12/23/2018: WBC 8,900, hemoglobin 11.4, hematocrit 35.2, MCV 79.1, platelets 322,000.  Ferritin was 18 with an iron saturation of 10% and a TIBC 451.  She received Feraheme on 11/10/2015 and 06/26/2018.  She is taking oral iron with vitamin C.  She is on oral B12.  Ferritin has been followed: 9 on 11/11/2011, 25 on 08/23/2013, 33 on 02/17/2015, 36 on 11/10/2015, 44 on 12/04/2016, 22 on 06/22/2018, and 18 on 12/24/2018.   Symptomatically, she denies blood in her stool, hematuria, weight loss.  Exam is unremarkable.    Plan: 1.   Labs today:  B12 and folate. 2.   Iron deficiency anemia  Review entire medical history, diagnosis and management of iron deficiency anemia.  Review CBCs and ferritin over the past several years.  Hemoglobin 11.4.  MCV 79.1.  Ferritin 18.    Discuss ferritin goal of 100.  Discuss plan for IV iron when ferritin is < 30 to prevent symptoms.  Feraheme today. 3.   Status post gastric bypass surgery  Check B12 and folate today.  She is currently on oral B12.  Patient to call back with current dose of oral B12 she is taking. 4.   Rule out bleeding diathesis  She notes a history of menorrhagia.  Patient to call back for labs (von Willebrand panel, PT, PTT, platelet function assay) with heavy menses 5.   RTC in 3 months for labs (CBC with  diff, ferritin). 6.   RTC in 6 months for MD assessment, labs (CBC with diff, ferritin-day before), and +/- Feraheme.  I discussed the assessment and treatment plan with the  patient.  The patient was provided an opportunity to ask questions and all were answered.  The patient agreed with the plan and demonstrated an understanding of the instructions.  The patient was advised to call back if the symptoms worsen or if the condition fails to improve as anticipated.  I provided 25 minutes of face-to-face time during this this encounter and > 50% was spent counseling as documented under my assessment and plan.    Rosey BathMelissa C Corcoran, MD, PhD    12/24/2018, 2:10 PM  I, Theador HawthorneAlexis Patterson, am acting as scribe for General MotorsMelissa C. Merlene Pullingorcoran, MD, PhD.  I, Melissa C. Merlene Pullingorcoran, MD, have reviewed the above documentation for accuracy and completeness, and I agree with the above.

## 2018-12-24 ENCOUNTER — Other Ambulatory Visit: Payer: BLUE CROSS/BLUE SHIELD

## 2018-12-24 ENCOUNTER — Ambulatory Visit: Payer: BLUE CROSS/BLUE SHIELD | Admitting: Hematology and Oncology

## 2018-12-24 ENCOUNTER — Inpatient Hospital Stay (HOSPITAL_BASED_OUTPATIENT_CLINIC_OR_DEPARTMENT_OTHER): Payer: BC Managed Care – PPO | Admitting: Hematology and Oncology

## 2018-12-24 ENCOUNTER — Inpatient Hospital Stay: Payer: BC Managed Care – PPO

## 2018-12-24 ENCOUNTER — Encounter: Payer: Self-pay | Admitting: Hematology and Oncology

## 2018-12-24 ENCOUNTER — Other Ambulatory Visit: Payer: Self-pay

## 2018-12-24 ENCOUNTER — Ambulatory Visit: Payer: BLUE CROSS/BLUE SHIELD

## 2018-12-24 VITALS — BP 113/73 | HR 82 | Resp 18

## 2018-12-24 VITALS — BP 125/78 | HR 101 | Temp 98.2°F | Resp 18 | Ht 67.0 in | Wt 292.6 lb

## 2018-12-24 DIAGNOSIS — D509 Iron deficiency anemia, unspecified: Secondary | ICD-10-CM

## 2018-12-24 DIAGNOSIS — E538 Deficiency of other specified B group vitamins: Secondary | ICD-10-CM

## 2018-12-24 DIAGNOSIS — I10 Essential (primary) hypertension: Secondary | ICD-10-CM | POA: Diagnosis not present

## 2018-12-24 DIAGNOSIS — R531 Weakness: Secondary | ICD-10-CM

## 2018-12-24 DIAGNOSIS — Z9884 Bariatric surgery status: Secondary | ICD-10-CM

## 2018-12-24 DIAGNOSIS — Z7984 Long term (current) use of oral hypoglycemic drugs: Secondary | ICD-10-CM | POA: Diagnosis not present

## 2018-12-24 DIAGNOSIS — R5383 Other fatigue: Secondary | ICD-10-CM

## 2018-12-24 DIAGNOSIS — R51 Headache: Secondary | ICD-10-CM

## 2018-12-24 DIAGNOSIS — Z7982 Long term (current) use of aspirin: Secondary | ICD-10-CM

## 2018-12-24 DIAGNOSIS — N920 Excessive and frequent menstruation with regular cycle: Secondary | ICD-10-CM

## 2018-12-24 DIAGNOSIS — Z79899 Other long term (current) drug therapy: Secondary | ICD-10-CM

## 2018-12-24 LAB — FOLATE: Folate: 12.4 ng/mL (ref >5.9)

## 2018-12-24 LAB — VITAMIN B12: Vitamin B-12: 304 pg/mL (ref 180–914)

## 2018-12-24 MED ORDER — SODIUM CHLORIDE 0.9 % IV SOLN
Freq: Once | INTRAVENOUS | Status: AC
  Administered 2018-12-24: 15:00:00 via INTRAVENOUS
  Filled 2018-12-24: qty 250

## 2018-12-24 MED ORDER — SODIUM CHLORIDE 0.9 % IV SOLN
510.0000 mg | Freq: Once | INTRAVENOUS | Status: AC
  Administered 2018-12-24: 510 mg via INTRAVENOUS
  Filled 2018-12-24: qty 17

## 2018-12-24 NOTE — Progress Notes (Signed)
No new changes noted today 

## 2018-12-24 NOTE — Patient Instructions (Signed)

## 2018-12-28 ENCOUNTER — Ambulatory Visit: Payer: BLUE CROSS/BLUE SHIELD | Admitting: Oncology

## 2018-12-28 ENCOUNTER — Ambulatory Visit: Payer: BLUE CROSS/BLUE SHIELD

## 2018-12-28 ENCOUNTER — Telehealth: Payer: Self-pay

## 2018-12-28 NOTE — Telephone Encounter (Signed)
Spoke with the patient to inform her that her B-12 levels were low and , Per Dr Mike Gip she would like to recheck level in 3 month or start B-12 injection. The patient was understanding and agreeable.

## 2018-12-29 DIAGNOSIS — E782 Mixed hyperlipidemia: Secondary | ICD-10-CM | POA: Diagnosis not present

## 2018-12-29 DIAGNOSIS — E1169 Type 2 diabetes mellitus with other specified complication: Secondary | ICD-10-CM | POA: Diagnosis not present

## 2018-12-29 DIAGNOSIS — E1165 Type 2 diabetes mellitus with hyperglycemia: Secondary | ICD-10-CM | POA: Diagnosis not present

## 2019-01-10 DIAGNOSIS — N920 Excessive and frequent menstruation with regular cycle: Secondary | ICD-10-CM | POA: Insufficient documentation

## 2019-01-10 DIAGNOSIS — E538 Deficiency of other specified B group vitamins: Secondary | ICD-10-CM | POA: Insufficient documentation

## 2019-01-11 ENCOUNTER — Other Ambulatory Visit: Payer: Self-pay

## 2019-01-11 NOTE — Progress Notes (Signed)
b12

## 2019-01-23 DIAGNOSIS — E119 Type 2 diabetes mellitus without complications: Secondary | ICD-10-CM | POA: Diagnosis not present

## 2019-02-23 DIAGNOSIS — E119 Type 2 diabetes mellitus without complications: Secondary | ICD-10-CM | POA: Diagnosis not present

## 2019-02-23 LAB — HEMOGLOBIN A1C: Hemoglobin A1C: 6.9

## 2019-02-26 DIAGNOSIS — E1165 Type 2 diabetes mellitus with hyperglycemia: Secondary | ICD-10-CM | POA: Diagnosis not present

## 2019-03-03 DIAGNOSIS — E782 Mixed hyperlipidemia: Secondary | ICD-10-CM | POA: Diagnosis not present

## 2019-03-03 DIAGNOSIS — E1169 Type 2 diabetes mellitus with other specified complication: Secondary | ICD-10-CM | POA: Diagnosis not present

## 2019-03-03 DIAGNOSIS — E669 Obesity, unspecified: Secondary | ICD-10-CM | POA: Diagnosis not present

## 2019-03-25 DIAGNOSIS — E119 Type 2 diabetes mellitus without complications: Secondary | ICD-10-CM | POA: Diagnosis not present

## 2019-04-25 DIAGNOSIS — E119 Type 2 diabetes mellitus without complications: Secondary | ICD-10-CM | POA: Diagnosis not present

## 2019-04-29 ENCOUNTER — Encounter: Payer: Self-pay | Admitting: Family Medicine

## 2019-04-29 ENCOUNTER — Ambulatory Visit: Payer: BC Managed Care – PPO | Admitting: Family Medicine

## 2019-04-29 ENCOUNTER — Other Ambulatory Visit: Payer: Self-pay

## 2019-04-29 DIAGNOSIS — E1159 Type 2 diabetes mellitus with other circulatory complications: Secondary | ICD-10-CM | POA: Diagnosis not present

## 2019-04-29 DIAGNOSIS — I10 Essential (primary) hypertension: Secondary | ICD-10-CM

## 2019-04-29 DIAGNOSIS — G44229 Chronic tension-type headache, not intractable: Secondary | ICD-10-CM | POA: Diagnosis not present

## 2019-04-29 DIAGNOSIS — I152 Hypertension secondary to endocrine disorders: Secondary | ICD-10-CM

## 2019-04-29 MED ORDER — LOSARTAN POTASSIUM-HCTZ 100-12.5 MG PO TABS: 1.0000 | ORAL_TABLET | Freq: Every day | ORAL | 1 refills | Status: DC

## 2019-04-29 MED ORDER — FLUTICASONE PROPIONATE 50 MCG/ACT NA SUSP: 2.0000 | Freq: Every day | NASAL | 11 refills | Status: DC

## 2019-04-29 NOTE — Patient Instructions (Signed)

## 2019-04-29 NOTE — Progress Notes (Signed)
Date:  04/29/2019   Name:  Jeanette Flores   DOB:  11/28/1975   MRN:  [id number]   Chief Complaint: Hypertension  Hypertension This is a chronic problem. The current episode started more than 1 year ago. The problem has been gradually improving since onset. The problem is controlled. Pertinent negatives include no anxiety, blurred vision, chest pain, headaches, malaise/fatigue, neck pain, orthopnea, palpitations, peripheral edema, PND, shortness of breath or sweats. There are no associated agents to hypertension. Risk factors for coronary artery disease include obesity. Past treatments include angiotensin blockers and diuretics. The current treatment provides moderate improvement. There are no compliance problems.  There is no history of angina, kidney disease, CAD/MI, CVA, heart failure, left ventricular hypertrophy, PVD or retinopathy. There is no history of chronic renal disease, a hypertension causing med or renovascular disease.    Review of Systems  Constitutional: Negative.  Negative for chills, fatigue, fever, malaise/fatigue and unexpected weight change.  HENT: Negative for congestion, ear discharge, ear pain, rhinorrhea, sinus pressure, sneezing and sore throat.   Eyes: Negative for blurred vision, photophobia, pain, discharge, redness and itching.  Respiratory: Negative for cough, shortness of breath, wheezing and stridor.   Cardiovascular: Negative for chest pain, palpitations, orthopnea and PND.  Gastrointestinal: Negative for abdominal pain, blood in stool, constipation, diarrhea, nausea and vomiting.  Endocrine: Negative for cold intolerance, heat intolerance, polydipsia, polyphagia and polyuria.  Genitourinary: Negative for dysuria, flank pain, frequency, hematuria, menstrual problem, pelvic pain, urgency, vaginal bleeding and vaginal discharge.  Musculoskeletal: Negative for arthralgias, back pain, myalgias and neck pain.  Skin: Negative for rash.  Allergic/Immunologic:  Negative for environmental allergies and food allergies.  Neurological: Positive for dizziness. Negative for weakness, light-headedness, numbness and headaches.  Hematological: Negative for adenopathy. Does not bruise/bleed easily.  Psychiatric/Behavioral: Negative for dysphoric mood. The patient is not nervous/anxious.     Patient Active Problem List   Diagnosis Date Noted  . Menorrhagia with regular cycle 01/10/2019  . B12 deficiency 01/10/2019  . Morbid obesity ([place]) 07/02/2017  . Hypertension associated with diabetes ([place]) 07/02/2017  . Dyslipidemia 07/02/2017  . Iron deficiency anemia 11/08/2015  . Essential hypertension 11/02/2015  . Type 2 diabetes mellitus without complication, without long-term current use of insulin ([place]) 11/02/2015  . Hyperlipidemia 11/02/2015  . Overweight 11/02/2015    Allergies  Allergen Reactions  . Azithromycin Hives    Past Surgical History:  Procedure Laterality Date  . Gastric bypass surgery      Social History   Tobacco Use  . Smoking status: Never Smoker  . Smokeless tobacco: Never Used  Substance Use Topics  . Alcohol use: No    Alcohol/week: 0.0 standard drinks  . Drug use: No     Medication list has been reviewed and updated.  Current Meds  Medication Sig  . aspirin 81 MG tablet Take 1 tablet (81 mg total) by mouth daily.  Marland Kitchen atorvastatin (LIPITOR) 20 MG tablet Take 1 tablet by mouth daily. Solum  . ferrous sulfate 325 (65 FE) MG tablet Take 325 mg by mouth daily with breakfast. otc  . losartan-hydrochlorothiazide (HYZAAR) 100-12.5 MG tablet Take 1 tablet by mouth daily.  . metFORMIN (GLUCOPHAGE XR) 500 MG 24 hr tablet Take 1 tablet (500 mg total) by mouth daily with breakfast. (Patient taking differently: Take 1,500 mg by mouth at bedtime. Dr Gabriel Carina)  . OZEMPIC 0.25 or 0.5 MG/DOSE SOPN Inject 0.5 mg as directed once a week. Dr Gabriel Carina    [facility] 2/9 Scores  10/26/2018 06/30/2017 11/02/2015  PHQ - 2 Score 0 0 0  PHQ- 9 Score 1 0 -     BP Readings from Last 3 Encounters:  04/29/19 110/70  12/24/18 113/73  12/24/18 125/78    Physical Exam Constitutional:      General: She is not in acute distress.    Appearance: She is not diaphoretic.  HENT:     Head: Normocephalic and atraumatic.     Right Ear: Tympanic membrane and external ear normal.     Left Ear: Tympanic membrane and external ear normal.     Nose: Nose normal.  Eyes:     General:        Right eye: No discharge.        Left eye: No discharge.     Conjunctiva/sclera: Conjunctivae normal.     Pupils: Pupils are equal, round, and reactive to light.  Neck:     Musculoskeletal: Normal range of motion and neck supple.     Thyroid: No thyromegaly.     Vascular: No JVD.  Cardiovascular:     Rate and Rhythm: Normal rate and regular rhythm.     Heart sounds: Normal heart sounds. No murmur. No friction rub. No gallop.   Pulmonary:     Effort: Pulmonary effort is normal.     Breath sounds: Normal breath sounds.  Abdominal:     General: Bowel sounds are normal.     Palpations: Abdomen is soft. There is no mass.     Tenderness: There is no abdominal tenderness. There is no guarding.  Musculoskeletal: Normal range of motion.  Lymphadenopathy:     Cervical: No cervical adenopathy.  Skin:    General: Skin is warm and dry.  Neurological:     Mental Status: She is alert.     Deep Tendon Reflexes: Reflexes are normal and symmetric.     Wt Readings from Last 3 Encounters:  04/29/19 280 lb (127 kg)  12/24/18 292 lb 8.8 oz (132.7 kg)  10/26/18 296 lb (134.3 kg)    BP 110/70   Pulse 72   Ht 5\' 7"  (1.702 m)   Wt 280 lb (127 kg)   LMP 04/23/2019 (Approximate)   BMI 43.85 kg/m   Assessment and Plan: 1. Hypertension associated with diabetes (HCC) Chronic.  Controlled.  Stable.  Continue losartan hydrochlorothiazide 100-12 0.5.  Reviewed labs from endocrine clinic.  Will recheck in 6 months. - losartan-hydrochlorothiazide (HYZAAR) 100-12.5 MG tablet; Take  1 tablet by mouth daily.  Dispense: 90 tablet; Refill: 1  2. Chronic tension-type headache, not intractable Patient has chronic tension type headaches which are sinus related.  We will continue her Flonase 2 sprays in both nostrils once a day - fluticasone (FLONASE) 50 MCG/ACT nasal spray; Place 2 sprays into both nostrils daily.  Dispense: 16 g; Refill: 11

## 2019-05-25 DIAGNOSIS — E119 Type 2 diabetes mellitus without complications: Secondary | ICD-10-CM | POA: Diagnosis not present

## 2019-06-15 DIAGNOSIS — Z1239 Encounter for other screening for malignant neoplasm of breast: Secondary | ICD-10-CM | POA: Diagnosis not present

## 2019-06-15 DIAGNOSIS — N6323 Unspecified lump in the left breast, lower outer quadrant: Secondary | ICD-10-CM | POA: Diagnosis not present

## 2019-06-15 DIAGNOSIS — Z1231 Encounter for screening mammogram for malignant neoplasm of breast: Secondary | ICD-10-CM | POA: Diagnosis not present

## 2019-06-15 DIAGNOSIS — N6314 Unspecified lump in the right breast, lower inner quadrant: Secondary | ICD-10-CM | POA: Diagnosis not present

## 2019-06-22 ENCOUNTER — Other Ambulatory Visit: Payer: Self-pay

## 2019-06-25 DIAGNOSIS — E119 Type 2 diabetes mellitus without complications: Secondary | ICD-10-CM | POA: Diagnosis not present

## 2019-07-21 DIAGNOSIS — Z20828 Contact with and (suspected) exposure to other viral communicable diseases: Secondary | ICD-10-CM | POA: Diagnosis not present

## 2019-07-21 DIAGNOSIS — Z03818 Encounter for observation for suspected exposure to other biological agents ruled out: Secondary | ICD-10-CM | POA: Diagnosis not present

## 2019-07-26 DIAGNOSIS — E119 Type 2 diabetes mellitus without complications: Secondary | ICD-10-CM | POA: Diagnosis not present

## 2019-08-13 DIAGNOSIS — Z20828 Contact with and (suspected) exposure to other viral communicable diseases: Secondary | ICD-10-CM | POA: Diagnosis not present

## 2019-08-13 DIAGNOSIS — Z03818 Encounter for observation for suspected exposure to other biological agents ruled out: Secondary | ICD-10-CM | POA: Diagnosis not present

## 2019-08-18 DIAGNOSIS — E669 Obesity, unspecified: Secondary | ICD-10-CM | POA: Diagnosis not present

## 2019-08-18 DIAGNOSIS — E1169 Type 2 diabetes mellitus with other specified complication: Secondary | ICD-10-CM | POA: Diagnosis not present

## 2019-08-23 DIAGNOSIS — E119 Type 2 diabetes mellitus without complications: Secondary | ICD-10-CM | POA: Diagnosis not present

## 2019-08-23 LAB — HM DIABETES EYE EXAM

## 2019-08-25 ENCOUNTER — Ambulatory Visit: Payer: BC Managed Care – PPO | Attending: Internal Medicine

## 2019-08-25 DIAGNOSIS — Z20822 Contact with and (suspected) exposure to covid-19: Secondary | ICD-10-CM | POA: Diagnosis not present

## 2019-08-25 DIAGNOSIS — E782 Mixed hyperlipidemia: Secondary | ICD-10-CM | POA: Diagnosis not present

## 2019-08-25 DIAGNOSIS — E669 Obesity, unspecified: Secondary | ICD-10-CM | POA: Diagnosis not present

## 2019-08-25 DIAGNOSIS — E1169 Type 2 diabetes mellitus with other specified complication: Secondary | ICD-10-CM | POA: Diagnosis not present

## 2019-08-26 LAB — NOVEL CORONAVIRUS, NAA: SARS-CoV-2, NAA: NOT DETECTED

## 2019-09-16 ENCOUNTER — Encounter: Payer: Self-pay | Admitting: Family Medicine

## 2019-09-22 ENCOUNTER — Encounter: Payer: Self-pay | Admitting: Family Medicine

## 2019-09-23 DIAGNOSIS — E119 Type 2 diabetes mellitus without complications: Secondary | ICD-10-CM | POA: Diagnosis not present

## 2019-10-23 DIAGNOSIS — E119 Type 2 diabetes mellitus without complications: Secondary | ICD-10-CM | POA: Diagnosis not present

## 2019-11-23 DIAGNOSIS — E119 Type 2 diabetes mellitus without complications: Secondary | ICD-10-CM | POA: Diagnosis not present

## 2019-12-23 DIAGNOSIS — E119 Type 2 diabetes mellitus without complications: Secondary | ICD-10-CM | POA: Diagnosis not present

## 2020-01-23 DIAGNOSIS — E119 Type 2 diabetes mellitus without complications: Secondary | ICD-10-CM | POA: Diagnosis not present

## 2020-02-23 DIAGNOSIS — E119 Type 2 diabetes mellitus without complications: Secondary | ICD-10-CM | POA: Diagnosis not present

## 2020-03-01 DIAGNOSIS — R5383 Other fatigue: Secondary | ICD-10-CM | POA: Diagnosis not present

## 2020-03-01 DIAGNOSIS — E669 Obesity, unspecified: Secondary | ICD-10-CM | POA: Diagnosis not present

## 2020-03-01 DIAGNOSIS — E1169 Type 2 diabetes mellitus with other specified complication: Secondary | ICD-10-CM | POA: Diagnosis not present

## 2020-03-01 LAB — HEMOGLOBIN A1C: Hemoglobin A1C: 6.8

## 2020-03-08 DIAGNOSIS — E669 Obesity, unspecified: Secondary | ICD-10-CM | POA: Diagnosis not present

## 2020-03-08 DIAGNOSIS — E1169 Type 2 diabetes mellitus with other specified complication: Secondary | ICD-10-CM | POA: Diagnosis not present

## 2020-03-10 DIAGNOSIS — D509 Iron deficiency anemia, unspecified: Secondary | ICD-10-CM | POA: Diagnosis not present

## 2020-03-10 DIAGNOSIS — R5383 Other fatigue: Secondary | ICD-10-CM | POA: Diagnosis not present

## 2020-03-18 NOTE — Progress Notes (Signed)
[facility]  [address] [place], [place] [address] Phone: [phone]  Fax: [phone]   Clinic Day:  03/20/2020  Referring physician: Duanne Limerick, MD  Chief Complaint: Jeanette Flores is a 44 y.o. female s/p gastric bypass (2008) with iron deficiency anemia who is seen for 14 month reassessment.   HPI: The patient was last seen in the hematology clinic on 12/24/2018 as a new patient. At that time, she denied blood in her stool, hematuria, weight loss. Exam was unremarkable.  Labs on 12/23/2018 revealed hematocrit 35.2, hemoglobin 11.4, MCV 79.1, platelets 322,000, WBC 8,900. Ferritin was 18 with an iron saturation of 10% and a TIBC of 451. Vitamin B12 was 304 and folate 12.4.   We discussed a plan for IV iron when ferritin is <30.  She received Feraheme. She remained on oral B12.  Labs drawn by PCP on 03/10/2020 revealed hematocrit 35.7, hemoglobin 10.8, MCV 73.5, platelets 325,000, WBC 8,900. Ferritin was 13. Vitamin B12 was 956 on 03/01/2020.   During the interim, the patient was doing well. She has been feeling tired x 1 month. She denies bleeding of any kind. She reports heavy periods. She denies feeling dizzy or lightheaded during her menses. If she stands up to quickly she become dizzy. She denies ice pica and restless legs.  She has shortness of breath on exertion.  When going up and down stairs at home she feels winded. She had less frequent headaches. She denies any B symptoms. She denies any GI or GU issues.    Past Medical History:  Diagnosis Date  . Anemia   . Hypertension     Past Surgical History:  Procedure Laterality Date  . Gastric bypass surgery      Family History  Problem Relation Age of Onset  . Diabetes Father   . Diabetes Paternal Grandmother     Social History:  reports that she has never smoked. She has never used smokeless tobacco. She reports that she does not drink alcohol and does not use drugs.She is  married with no children. During COVID-19 she works from home for [employer] and [employer]; she is a Production designer, theatre/television/film. She denies any exposure to toxins.  She lives in [place].  The patient is alone today.  Allergies:  Allergies  Allergen Reactions  . Azithromycin Hives    Current Medications: Current Outpatient Medications  Medication Sig Dispense Refill  . aspirin 81 MG tablet Take 1 tablet (81 mg total) by mouth daily. 30 tablet 11  . atorvastatin (LIPITOR) 20 MG tablet Take 1 tablet by mouth daily. Solum    . ferrous sulfate 325 (65 FE) MG tablet Take 325 mg by mouth daily with breakfast. otc    . losartan-hydrochlorothiazide (HYZAAR) 100-12.5 MG tablet Take 1 tablet by mouth daily. 90 tablet 1  . metFORMIN (GLUCOPHAGE XR) 500 MG 24 hr tablet Take 1 tablet (500 mg total) by mouth daily with breakfast. (Patient taking differently: Take 1,500 mg by mouth at bedtime. Dr Tedd Sias) 90 tablet 1  . OZEMPIC 0.25 or 0.5 MG/DOSE SOPN Inject 0.5 mg as directed once a week. Dr Tedd Sias  1  . fluticasone (FLONASE) 50 MCG/ACT nasal spray Place 2 sprays into both nostrils daily. (Patient not taking: Reported on 03/20/2020) 16 g 11   No current facility-administered medications for this visit.    Review of Systems  Constitutional: Positive for malaise/fatigue (x 1 month). Negative for chills, fever and weight loss.  Doing well.  HENT: Negative.  Negative for congestion, hearing loss, nosebleeds, sinus pain and sore throat.   Eyes: Negative.  Negative for blurred vision, double vision, photophobia and pain.  Respiratory: Positive for shortness of breath (on exertion, going up stairs). Negative for cough and hemoptysis.   Cardiovascular: Negative.  Negative for chest pain, palpitations and leg swelling.  Gastrointestinal: Negative.  Negative for abdominal pain, blood in stool, constipation, diarrhea, heartburn, melena, nausea and vomiting.       No ice pica.  Genitourinary: Negative for dysuria, hematuria  and urgency.       Heavy menses.  Musculoskeletal: Negative.  Negative for back pain, joint pain and myalgias.  Skin: Negative.  Negative for rash.  Neurological: Positive for dizziness (upon standing to quick). Negative for tingling, sensory change, weakness and headaches (less frequent).       No restless legs.  Endo/Heme/Allergies: Negative.  Does not bruise/bleed easily.  Psychiatric/Behavioral: Negative.  Negative for depression and memory loss. The patient is not nervous/anxious and does not have insomnia.   All other systems reviewed and are negative.  Performance status (ECOG): 1  Vital Signs  Blood pressure 135/83, pulse 80, temperature 98.3 F (36.8 C), temperature source Tympanic, weight 281 lb 8.4 oz (127.7 kg), SpO2 100 %.  Physical Exam Vitals and nursing note reviewed.  Constitutional:      General: She is not in acute distress.    Appearance: She is well-developed. She is not diaphoretic.     Interventions: Face mask in place.  HENT:     Head: Normocephalic and atraumatic.     Comments: Black hair.    Mouth/Throat:     Pharynx: No oropharyngeal exudate.  Eyes:     General: No scleral icterus.    Conjunctiva/sclera: Conjunctivae normal.     Pupils: Pupils are equal, round, and reactive to light.     Comments: Glasses.  Brown eyes.  Cardiovascular:     Rate and Rhythm: Normal rate and regular rhythm.     Heart sounds: Normal heart sounds. No murmur heard.   Pulmonary:     Effort: Pulmonary effort is normal. No respiratory distress.     Breath sounds: Normal breath sounds.  Abdominal:     General: Bowel sounds are normal.     Palpations: Abdomen is soft.     Tenderness: There is no abdominal tenderness.  Musculoskeletal:        General: No tenderness. Normal range of motion.     Cervical back: Normal range of motion and neck supple.  Lymphadenopathy:     Head:     Right side of head: No preauricular, posterior auricular or occipital adenopathy.     Left  side of head: No preauricular, posterior auricular or occipital adenopathy.     Cervical: No cervical adenopathy.     Upper Body:     Right upper body: No supraclavicular or axillary adenopathy.     Left upper body: No supraclavicular or axillary adenopathy.     Lower Body: No right inguinal adenopathy. No left inguinal adenopathy.  Skin:    General: Skin is warm and dry.  Neurological:     Mental Status: She is alert and oriented to person, place, and time.     Deep Tendon Reflexes: Reflexes are normal and symmetric.  Psychiatric:        Behavior: Behavior normal.        Thought Content: Thought content normal.  Judgment: Judgment normal.    No visits with results within 3 Day(s) from this visit.  Latest known visit with results is:  Lab on 08/25/2019  Component Date Value Ref Range Status  . SARS-CoV-2, NAA 08/25/2019 Not Detected  Not Detected Final   Comment: This nucleic acid amplification test was developed and its performance characteristics determined by [employer]. Nucleic acid amplification tests include RT-PCR and TMA. This test has not been FDA cleared or approved. This test has been authorized by FDA under an Emergency Use Authorization (EUA). This test is only authorized for the duration of time the declaration that circumstances exist justifying the authorization of the emergency use of in vitro diagnostic tests for detection of SARS-CoV-2 virus and/or diagnosis of COVID-19 infection under section 564(b)(1) of the Act, 21 U.S.C. 671IWP-8(K) (1), unless the authorization is terminated or revoked sooner. When diagnostic testing is negative, the possibility of a false negative result should be considered in the context of a patient's recent exposures and the presence of clinical signs and symptoms consistent with COVID-19. An individual without symptoms of COVID-19 and who is not shedding SARS-CoV-2 virus wo                          uld expect to have  a negative (not detected) result in this assay.     Assessment:  Jeanette Flores is a 44 y.o. female s/p gastric bypass surgery (2008) with iron deficiency anemia.  She has menorrhagia.  Diet is good.  She has no pica.  Work-up on 12/23/2018 revealed hematocrit 35.2, hemoglobin 11.4, MCV 79.1, platelets 322,000, WBC 8,900. Ferritin was 18 with an iron saturation of 10% and a TIBC of 451. Vitamin B12 was 304 and folate 12.4.   She received Feraheme on 11/10/2015, 06/26/2018, and 12/24/2018.  She is taking oral iron with vitamin C.  She is on oral B12.  Ferritin has been followed: 9 on 11/11/2011, 25 on 08/23/2013, 33 on 02/17/2015, 36 on 11/10/2015, 44 on 12/04/2016, 22 on 06/22/2018, and 18 on 12/24/2018.   Symptomatically, she has been fatigued x 1 month.  She has heavy menses.  She has shortness of breath on exertion (stairs).   Plan: 1.   Labs today: CBC with diff, CMP, ferritin, iron studies, B12 and folate. 2.   Iron deficiency anemia  Review interim events.  She is s/p gastric bypass surgery.  Labs on 03/10/2020 revealed a hematocrit 35.7, hemoglobin 10.8, and MCV 73.5.    Ferritin was 13.   Ferritin goal is 100.  Review plan for IV iron when ferritin is < 30 to prevent symptoms.  Preauth Feraheme.  Urine pregnancy test.  Feraheme x1 today. 3.   Menorrhagia  Consider testing (von Willebrand panel, PT, PTT, platelet function assay) with heavy menses. 4.   B12 deficiency  B12 was 956 on 03/01/2020.  Check folate annually. 5.   RTC in 3 months for labs (CBC with diff, ferritin, iron studies). 6.   RTC in 6 months for MD assessment, labs (CBC with diff, ferritin-day before) and +/- Feraheme.  I discussed the assessment and treatment plan with the patient.  The patient was provided an opportunity to ask questions and all were answered.  The patient agreed with the plan and demonstrated an understanding of the instructions.  The patient was advised to call back if the symptoms worsen  or if the condition fails to improve as anticipated.   Rosey Bath, MD,  PhD    03/20/2020, 1:37 PM  I, Theador Hawthorne, am acting as scribe for [employer]. Merlene Pulling, MD, PhD.  I, Itzayanna Kaster C. Merlene Pulling, MD, have reviewed the above documentation for accuracy and completeness, and I agree with the above.

## 2020-03-20 ENCOUNTER — Other Ambulatory Visit: Payer: Self-pay

## 2020-03-20 ENCOUNTER — Inpatient Hospital Stay: Payer: BC Managed Care – PPO | Attending: Hematology and Oncology | Admitting: Hematology and Oncology

## 2020-03-20 ENCOUNTER — Other Ambulatory Visit: Payer: Self-pay | Admitting: Hematology and Oncology

## 2020-03-20 ENCOUNTER — Encounter: Payer: Self-pay | Admitting: Hematology and Oncology

## 2020-03-20 ENCOUNTER — Inpatient Hospital Stay: Payer: BC Managed Care – PPO

## 2020-03-20 VITALS — BP 153/84 | HR 88 | Resp 18

## 2020-03-20 VITALS — BP 135/83 | HR 80 | Temp 98.3°F | Wt 281.5 lb

## 2020-03-20 DIAGNOSIS — D509 Iron deficiency anemia, unspecified: Secondary | ICD-10-CM

## 2020-03-20 DIAGNOSIS — E538 Deficiency of other specified B group vitamins: Secondary | ICD-10-CM

## 2020-03-20 DIAGNOSIS — Z833 Family history of diabetes mellitus: Secondary | ICD-10-CM | POA: Insufficient documentation

## 2020-03-20 DIAGNOSIS — Z7982 Long term (current) use of aspirin: Secondary | ICD-10-CM | POA: Diagnosis not present

## 2020-03-20 DIAGNOSIS — N920 Excessive and frequent menstruation with regular cycle: Secondary | ICD-10-CM | POA: Insufficient documentation

## 2020-03-20 DIAGNOSIS — Z79899 Other long term (current) drug therapy: Secondary | ICD-10-CM | POA: Diagnosis not present

## 2020-03-20 DIAGNOSIS — D538 Other specified nutritional anemias: Secondary | ICD-10-CM | POA: Diagnosis not present

## 2020-03-20 DIAGNOSIS — Z9884 Bariatric surgery status: Secondary | ICD-10-CM | POA: Diagnosis not present

## 2020-03-20 DIAGNOSIS — I10 Essential (primary) hypertension: Secondary | ICD-10-CM | POA: Diagnosis not present

## 2020-03-20 DIAGNOSIS — Z7984 Long term (current) use of oral hypoglycemic drugs: Secondary | ICD-10-CM | POA: Diagnosis not present

## 2020-03-20 DIAGNOSIS — D508 Other iron deficiency anemias: Secondary | ICD-10-CM | POA: Insufficient documentation

## 2020-03-20 LAB — PREGNANCY, URINE: Preg Test, Ur: NEGATIVE

## 2020-03-20 MED ORDER — SODIUM CHLORIDE 0.9 % IV SOLN
510.0000 mg | Freq: Once | INTRAVENOUS | Status: AC
  Administered 2020-03-20: 510 mg via INTRAVENOUS
  Filled 2020-03-20: qty 17

## 2020-03-20 MED ORDER — SODIUM CHLORIDE 0.9 % IV SOLN
Freq: Once | INTRAVENOUS | Status: AC
  Filled 2020-03-20: qty 250

## 2020-03-20 NOTE — Patient Instructions (Signed)

## 2020-03-20 NOTE — Progress Notes (Signed)
No new changes noted today 

## 2020-03-24 DIAGNOSIS — E119 Type 2 diabetes mellitus without complications: Secondary | ICD-10-CM | POA: Diagnosis not present

## 2020-04-16 DIAGNOSIS — Z6841 Body Mass Index (BMI) 40.0 and over, adult: Secondary | ICD-10-CM | POA: Diagnosis not present

## 2020-04-16 DIAGNOSIS — Z1152 Encounter for screening for COVID-19: Secondary | ICD-10-CM | POA: Diagnosis not present

## 2020-04-16 DIAGNOSIS — Z20822 Contact with and (suspected) exposure to covid-19: Secondary | ICD-10-CM | POA: Diagnosis not present

## 2020-04-24 DIAGNOSIS — E119 Type 2 diabetes mellitus without complications: Secondary | ICD-10-CM | POA: Diagnosis not present

## 2020-05-01 ENCOUNTER — Ambulatory Visit (INDEPENDENT_AMBULATORY_CARE_PROVIDER_SITE_OTHER): Payer: BLUE CROSS/BLUE SHIELD | Admitting: Podiatry

## 2020-05-01 ENCOUNTER — Encounter: Payer: Self-pay | Admitting: Podiatry

## 2020-05-01 ENCOUNTER — Other Ambulatory Visit: Payer: Self-pay

## 2020-05-01 DIAGNOSIS — E119 Type 2 diabetes mellitus without complications: Secondary | ICD-10-CM | POA: Diagnosis not present

## 2020-05-01 DIAGNOSIS — L603 Nail dystrophy: Secondary | ICD-10-CM | POA: Diagnosis not present

## 2020-05-01 NOTE — Progress Notes (Signed)
This patient presents to the office stating that her second toe has split in her nail and is causing pain when she walks and wears her shoes.  She says that the pain has been present for 2 months.  Patient was seen by myself in 2018 for an injury to the second toenail on the left foot.  She has been pain-free except for the last 2 months.   She presents the office today for an evaluation and treatment of this painful second toenail left foot  Vascular  Dorsalis pedis and posterior tibial pulses are palpable  B/L.  Capillary return  WNL.  Temperature gradient is  WNL.  Skin turgor  WNL  Sensorium  Senn Weinstein monofilament wire  WNL. Normal tactile sensation.  Nail Exam  Patient has normal nails with no evidence of bacterial or fungal infection. Second toenail left foot is thickened with black area noted under her second toenail  Orthopedic  Exam  Muscle tone and muscle strength  WNL.  No limitations of motion feet  B/L.  No crepitus or joint effusion noted.  Foot type is unremarkable and digits show no abnormalities.  Bony prominences are unremarkable.  Skin  No open lesions.  Normal skin texture and turgor.  Nail Dystrophy.  Sebride nail second toe left with a dremel tool.  Discussed future nail surgery.  RTC prn.   Helane Gunther DPM

## 2020-05-24 DIAGNOSIS — E119 Type 2 diabetes mellitus without complications: Secondary | ICD-10-CM | POA: Diagnosis not present

## 2020-05-30 ENCOUNTER — Encounter: Payer: Self-pay | Admitting: Family Medicine

## 2020-06-05 ENCOUNTER — Ambulatory Visit: Payer: BC Managed Care – PPO | Admitting: Family Medicine

## 2020-06-07 ENCOUNTER — Ambulatory Visit (INDEPENDENT_AMBULATORY_CARE_PROVIDER_SITE_OTHER): Payer: BC Managed Care – PPO | Admitting: Family Medicine

## 2020-06-07 ENCOUNTER — Other Ambulatory Visit: Payer: Self-pay

## 2020-06-07 ENCOUNTER — Encounter: Payer: Self-pay | Admitting: Family Medicine

## 2020-06-07 ENCOUNTER — Other Ambulatory Visit (HOSPITAL_COMMUNITY)
Admission: RE | Admit: 2020-06-07 | Discharge: 2020-06-07 | Disposition: A | Discharge status: Home / Self Care | Payer: BC Managed Care – PPO | Source: Ambulatory Visit | Attending: Family Medicine | Admitting: Family Medicine

## 2020-06-07 VITALS — BP 120/80 | HR 76 | Ht 67.0 in | Wt 281.0 lb

## 2020-06-07 DIAGNOSIS — Z6841 Body Mass Index (BMI) 40.0 and over, adult: Secondary | ICD-10-CM | POA: Diagnosis not present

## 2020-06-07 DIAGNOSIS — Z1231 Encounter for screening mammogram for malignant neoplasm of breast: Secondary | ICD-10-CM | POA: Diagnosis not present

## 2020-06-07 DIAGNOSIS — Z124 Encounter for screening for malignant neoplasm of cervix: Secondary | ICD-10-CM

## 2020-06-07 DIAGNOSIS — Z0000 Encounter for general adult medical examination without abnormal findings: Secondary | ICD-10-CM | POA: Diagnosis not present

## 2020-06-07 NOTE — Progress Notes (Signed)
Date:  06/07/2020   Name:  Jeanette Flores   DOB:  Jan 16, 1976   MRN:  [id number]   Chief Complaint: Annual Exam  Patient is a 44 year old female who presents for a comprehensive physical exam with PAP/pelvic. The patient reports the following problems: none. Health maintenance has been reviewed up to date.   Lab Results  Component Value Date   CREATININE 0.64 07/02/2017   BUN 10 07/02/2017   NA 137 07/02/2017   K 4.9 07/02/2017   CL 97 07/02/2017   CO2 23 07/02/2017   Lab Results  Component Value Date   CHOL 189 07/02/2017   HDL 33 (L) 07/02/2017   LDLCALC 133 (H) 07/02/2017   TRIG 115 07/02/2017   CHOLHDL 5.7 (H) 07/02/2017   No results found for: TSH Lab Results  Component Value Date   HGBA1C 6.8 03/01/2020   Lab Results  Component Value Date   WBC 8.9 12/23/2018   HGB 11.4 (L) 12/23/2018   HCT 35.2 (L) 12/23/2018   MCV 79.1 (L) 12/23/2018   PLT 322 12/23/2018   No results found for: ALT, AST, GGT, ALKPHOS, BILITOT   Review of Systems  Constitutional: Negative.  Negative for chills, fatigue, fever and unexpected weight change.  HENT: Negative for congestion, ear discharge, ear pain, rhinorrhea, sinus pressure, sneezing and sore throat.   Eyes: Negative for double vision, photophobia, pain, discharge, redness and itching.  Respiratory: Negative for cough, shortness of breath, wheezing and stridor.   Gastrointestinal: Negative for abdominal pain, blood in stool, constipation, diarrhea, nausea and vomiting.  Endocrine: Negative for cold intolerance, heat intolerance, polydipsia, polyphagia and polyuria.  Genitourinary: Negative for dysuria, flank pain, frequency, hematuria, menstrual problem, pelvic pain, urgency, vaginal bleeding and vaginal discharge.  Musculoskeletal: Negative for arthralgias, back pain and myalgias.  Skin: Negative for rash.  Allergic/Immunologic: Negative for environmental allergies and food allergies.  Neurological: Negative for  dizziness, weakness, light-headedness, numbness and headaches.  Hematological: Negative for adenopathy. Does not bruise/bleed easily.  Psychiatric/Behavioral: Negative for dysphoric mood. The patient is not nervous/anxious.     Patient Active Problem List   Diagnosis Date Noted  . Nail dystrophy 05/01/2020  . Menorrhagia with regular cycle 01/10/2019  . B12 deficiency 01/10/2019  . Morbid obesity (HCC) 07/02/2017  . Hypertension associated with diabetes (HCC) 07/02/2017  . Dyslipidemia 07/02/2017  . Iron deficiency anemia 11/08/2015  . Essential hypertension 11/02/2015  . Type 2 diabetes mellitus without complication, without long-term current use of insulin (HCC) 11/02/2015  . Hyperlipidemia 11/02/2015  . Overweight 11/02/2015    Allergies  Allergen Reactions  . Azithromycin Hives    Past Surgical History:  Procedure Laterality Date  . Gastric bypass surgery      Social History   Tobacco Use  . Smoking status: Never Smoker  . Smokeless tobacco: Never Used  Substance Use Topics  . Alcohol use: No    Alcohol/week: 0.0 standard drinks  . Drug use: No     Medication list has been reviewed and updated.  Current Meds  Medication Sig  . aspirin 81 MG tablet Take 1 tablet (81 mg total) by mouth daily.  Marland Kitchen. atorvastatin (LIPITOR) 20 MG tablet Take 1 tablet by mouth daily. Solum  . ferrous sulfate 325 (65 FE) MG tablet Take 325 mg by mouth daily with breakfast. otc  . losartan-hydrochlorothiazide (HYZAAR) 100-12.5 MG tablet Take 1 tablet by mouth daily.  . metFORMIN (GLUCOPHAGE XR) 500 MG 24 hr tablet Take 1 tablet (500  mg total) by mouth daily with breakfast. (Patient taking differently: Take 1,500 mg by mouth at bedtime. Dr Tedd Sias)  . OZEMPIC 0.25 or 0.5 MG/DOSE SOPN Inject 0.5 mg as directed once a week. Dr Tedd Sias    [facility] 2/9 Scores 06/07/2020 10/26/2018 06/30/2017 11/02/2015  PHQ - 2 Score 0 0 0 0  PHQ- 9 Score 5 1 0 -    GAD 7 : Generalized Anxiety Score 06/07/2020   Nervous, Anxious, on Edge 1  Control/stop worrying 0  Worry too much - different things 0  Trouble relaxing 1  Restless 0  Easily annoyed or irritable 1  Afraid - awful might happen 0  Total GAD 7 Score 3  Anxiety Difficulty Somewhat difficult    BP Readings from Last 3 Encounters:  06/07/20 120/80  03/20/20 (!) 153/84  03/20/20 135/83    Physical Exam Vitals and nursing note reviewed. Exam conducted with a chaperone present.  Constitutional:      Appearance: Normal appearance. She is well-developed, well-groomed and well-nourished. She is obese.  HENT:     Head: Normocephalic.     Jaw: There is normal jaw occlusion.     Right Ear: Hearing, tympanic membrane, ear canal and external ear normal.     Left Ear: Hearing, tympanic membrane, ear canal and external ear normal.     Nose: Nose normal.     Right Turbinates: Not enlarged.     Left Turbinates: Not enlarged.     Mouth/Throat:     Lips: Pink.     Mouth: Oropharynx is clear and moist. Mucous membranes are moist.     Dentition: Normal dentition.     Tongue: No lesions.     Palate: No mass.     Pharynx: Oropharynx is clear. Uvula midline.  Eyes:     General: Lids are normal. Vision grossly intact. Gaze aligned appropriately. No scleral icterus.    Extraocular Movements: Extraocular movements intact and EOM normal.     Conjunctiva/sclera: Conjunctivae normal.     Right eye: Right conjunctiva is not injected.     Left eye: Left conjunctiva is not injected.     Pupils: Pupils are equal, round, and reactive to light.  Neck:     Thyroid: No thyroid mass, thyromegaly or thyroid tenderness.     Vascular: Normal carotid pulses. No carotid bruit, hepatojugular reflux or JVD.     Trachea: Trachea and phonation normal. No tracheal deviation.  Cardiovascular:     Rate and Rhythm: Normal rate and regular rhythm.     Chest Wall: PMI is not displaced.     Pulses: Normal pulses and intact distal pulses.          Carotid pulses are  2+ on the right side and 2+ on the left side.      Radial pulses are 2+ on the right side and 2+ on the left side.       Femoral pulses are 2+ on the right side and 2+ on the left side.      Popliteal pulses are 2+ on the right side and 2+ on the left side.       Dorsalis pedis pulses are 2+ on the right side and 2+ on the left side.       Posterior tibial pulses are 2+ on the right side and 2+ on the left side.     Heart sounds: S1 normal and S2 normal. Murmur heard.   Systolic murmur is present with a grade of  2/6.  No diastolic murmur is present. No friction rub. No gallop. No S3 or S4 sounds.   Pulmonary:     Effort: Pulmonary effort is normal. No respiratory distress.     Breath sounds: Normal breath sounds and air entry. No decreased air movement. No decreased breath sounds, wheezing, rhonchi or rales.  Chest:  Breasts:     Right: Normal. No swelling, bleeding, inverted nipple, mass, nipple discharge, skin change, tenderness, axillary adenopathy or supraclavicular adenopathy.     Left: Normal. No swelling, bleeding, inverted nipple, mass, nipple discharge, skin change, tenderness, axillary adenopathy or supraclavicular adenopathy.    Abdominal:     General: Bowel sounds are normal.     Palpations: Abdomen is soft. There is no hepatomegaly, splenomegaly, hepatosplenomegaly or mass.     Tenderness: There is no abdominal tenderness. There is no guarding or rebound.     Hernia: A hernia is present. Hernia is present in the umbilical area. There is no hernia in the ventral area, left inguinal area or right inguinal area.  Genitourinary:    General: Normal vulva.     Exam position: Lithotomy position.     Labia:        Right: No lesion.        Left: No lesion.      Vagina: Normal.     Cervix: No friability or erythema.     Uterus: Normal.      Adnexa: Right adnexa normal and left adnexa normal.     Rectum: Normal. Guaiac result negative. No mass, tenderness, anal fissure or external  hemorrhoid.  Musculoskeletal:        General: No tenderness or edema. Normal range of motion.     Cervical back: Normal, full passive range of motion without pain, normal range of motion and neck supple.     Thoracic back: Normal.     Lumbar back: Normal.     Right lower leg: No edema.     Left lower leg: No edema.  Lymphadenopathy:     Head:     Right side of head: No submental, submandibular or tonsillar adenopathy.     Left side of head: No submental, submandibular or tonsillar adenopathy.     Cervical: No cervical adenopathy.     Right cervical: No superficial, deep or posterior cervical adenopathy.    Left cervical: No superficial, deep or posterior cervical adenopathy.     Upper Body:     Right upper body: No supraclavicular, axillary or pectoral adenopathy.     Left upper body: No supraclavicular, axillary or pectoral adenopathy.     Lower Body: No right inguinal adenopathy. No left inguinal adenopathy.  Skin:    General: Skin is warm.     Capillary Refill: Capillary refill takes less than 2 seconds.     Coloration: Skin is not ashen.     Findings: No rash.  Neurological:     General: No focal deficit present.     Mental Status: She is alert and oriented to person, place, and time.     Cranial Nerves: Cranial nerves are intact. No cranial nerve deficit.     Sensory: Sensation is intact.     Motor: Motor function is intact.     Deep Tendon Reflexes: Strength normal and reflexes are normal and symmetric. Reflexes normal.     Reflex Scores:      Tricep reflexes are 2+ on the right side and 2+ on the left side.  Bicep reflexes are 2+ on the right side and 2+ on the left side.      Brachioradialis reflexes are 2+ on the right side and 2+ on the left side.      Patellar reflexes are 2+ on the right side and 2+ on the left side.      Achilles reflexes are 2+ on the right side and 2+ on the left side. Psychiatric:        Mood and Affect: Mood and affect normal. Mood is not  anxious or depressed.        Behavior: Behavior is cooperative.     Wt Readings from Last 3 Encounters:  06/07/20 281 lb (127.5 kg)  03/20/20 281 lb 8.4 oz (127.7 kg)  04/29/19 280 lb (127 kg)    BP 120/80   Pulse 76   Ht 5\' 7"  (1.702 m)   Wt 281 lb (127.5 kg)   LMP 05/29/2020 (Exact Date)   BMI 44.01 kg/m   Assessment and Plan:  1. Annual physical exam No subjective/objective concerns noted during history and physical exam.Jeanette Flores is a 44 y.o. female who presents today for her Complete Annual Exam. She feels well. She reports exercising . She reports she is sleeping well. Immunizations are reviewed and recommendations provided.   Age appropriate screening tests are discussed. Counseling given for risk factor reduction interventions.  We will obtain renal function panel lipid panel for current status. - Renal Function Panel - Lipid Panel With LDL/HDL Ratio - Cytology - PAP  2. Encounter for Papanicolaou smear for cervical cancer screening Patient is due for a Pap smear which was done today.  We also will add HPV for screening as well. - Cytology - PAP  3. Encounter for screening mammogram for malignant neoplasm of breast Breast exam today was normal with no palpable mass.  Will obtain a mammogram for surveillance.  4. BMI 40.0-44.9, adult [facility]) Health risks of being over weight were discussed and patient was counseled on weight loss options and exercise.  Patient was given a low-cholesterol low triglyceride sheet.

## 2020-06-07 NOTE — Patient Instructions (Signed)

## 2020-06-08 LAB — LIPID PANEL WITH LDL/HDL RATIO
Cholesterol, Total: 156 mg/dL (ref 100–199)
HDL: 36 mg/dL — ABNORMAL LOW (ref >39)
LDL Chol Calc (NIH): 102 mg/dL — ABNORMAL HIGH (ref 0–99)
LDL/HDL Ratio: 2.8 ratio (ref 0.0–3.2)
Triglycerides: 98 mg/dL (ref 0–149)
VLDL Cholesterol Cal: 18 mg/dL (ref 5–40)

## 2020-06-08 LAB — RENAL FUNCTION PANEL
Albumin: 3.7 g/dL — ABNORMAL LOW (ref 3.8–4.8)
BUN/Creatinine Ratio: 9 (ref 9–23)
BUN: 8 mg/dL (ref 6–24)
CO2: 20 mmol/L (ref 20–29)
Calcium: 9.1 mg/dL (ref 8.7–10.2)
Chloride: 99 mmol/L (ref 96–106)
Creatinine, Ser: 0.91 mg/dL (ref 0.57–1.00)
GFR calc Af Amer: 90 mL/min/{1.73_m2} (ref >59)
GFR calc non Af Amer: 78 mL/min/{1.73_m2} (ref >59)
Glucose: 103 mg/dL — ABNORMAL HIGH (ref 65–99)
Phosphorus: 4.2 mg/dL (ref 3.0–4.3)
Potassium: 4.7 mmol/L (ref 3.5–5.2)
Sodium: 137 mmol/L (ref 134–144)

## 2020-06-08 LAB — CYTOLOGY - PAP
Comment: NEGATIVE
Diagnosis: NEGATIVE
High risk HPV: NEGATIVE

## 2020-06-19 ENCOUNTER — Inpatient Hospital Stay: Payer: BC Managed Care – PPO | Attending: Hematology and Oncology

## 2020-06-19 ENCOUNTER — Other Ambulatory Visit: Payer: Self-pay

## 2020-06-19 DIAGNOSIS — D509 Iron deficiency anemia, unspecified: Secondary | ICD-10-CM | POA: Insufficient documentation

## 2020-06-19 LAB — CBC
HCT: 36.4 % (ref 36.0–46.0)
Hemoglobin: 11.3 g/dL — ABNORMAL LOW (ref 12.0–15.0)
MCH: 23.7 pg — ABNORMAL LOW (ref 26.0–34.0)
MCHC: 31 g/dL (ref 30.0–36.0)
MCV: 76.3 fL — ABNORMAL LOW (ref 80.0–100.0)
Platelets: 351 10*3/uL (ref 150–400)
RBC: 4.77 MIL/uL (ref 3.87–5.11)
RDW: 17.8 % — ABNORMAL HIGH (ref 11.5–15.5)
WBC: 7.4 10*3/uL (ref 4.0–10.5)
nRBC: 0 % (ref 0.0–0.2)

## 2020-06-19 LAB — FERRITIN: Ferritin: 12 ng/mL (ref 11–307)

## 2020-06-19 LAB — IRON AND TIBC
Iron: 36 ug/dL (ref 28–170)
Saturation Ratios: 8 % — ABNORMAL LOW (ref 10.4–31.8)
TIBC: 461 ug/dL — ABNORMAL HIGH (ref 250–450)
UIBC: 425 ug/dL

## 2020-06-20 ENCOUNTER — Other Ambulatory Visit: Payer: Self-pay | Admitting: Hematology and Oncology

## 2020-06-20 DIAGNOSIS — Z1239 Encounter for other screening for malignant neoplasm of breast: Secondary | ICD-10-CM | POA: Diagnosis not present

## 2020-06-20 DIAGNOSIS — Z1231 Encounter for screening mammogram for malignant neoplasm of breast: Secondary | ICD-10-CM | POA: Diagnosis not present

## 2020-06-21 ENCOUNTER — Inpatient Hospital Stay: Payer: BC Managed Care – PPO

## 2020-06-21 ENCOUNTER — Ambulatory Visit: Payer: BC Managed Care – PPO

## 2020-06-21 ENCOUNTER — Other Ambulatory Visit: Payer: Self-pay

## 2020-06-21 VITALS — BP 145/77 | HR 90 | Temp 97.0°F | Resp 18

## 2020-06-21 DIAGNOSIS — D509 Iron deficiency anemia, unspecified: Secondary | ICD-10-CM

## 2020-06-21 LAB — PREGNANCY, URINE: Preg Test, Ur: NEGATIVE

## 2020-06-21 MED ORDER — SODIUM CHLORIDE 0.9 % IV SOLN: 200.0000 mg | Freq: Once | INTRAVENOUS | Status: DC

## 2020-06-21 MED ORDER — IRON SUCROSE 20 MG/ML IV SOLN
200.0000 mg | Freq: Once | INTRAVENOUS | Status: AC
  Administered 2020-06-21: 200 mg via INTRAVENOUS
  Filled 2020-06-21: qty 10

## 2020-06-21 MED ORDER — SODIUM CHLORIDE 0.9 % IV SOLN
Freq: Once | INTRAVENOUS | Status: AC
  Filled 2020-06-21: qty 250

## 2020-06-21 NOTE — Progress Notes (Signed)
Pt received IV venofer infusion. Tolerated well. VSS at d/c. °

## 2020-06-24 DIAGNOSIS — E119 Type 2 diabetes mellitus without complications: Secondary | ICD-10-CM | POA: Diagnosis not present

## 2020-06-28 ENCOUNTER — Inpatient Hospital Stay: Payer: BC Managed Care – PPO

## 2020-06-28 ENCOUNTER — Other Ambulatory Visit: Payer: Self-pay

## 2020-07-05 ENCOUNTER — Inpatient Hospital Stay: Payer: BC Managed Care – PPO | Attending: Hematology and Oncology

## 2020-07-05 ENCOUNTER — Inpatient Hospital Stay: Payer: BC Managed Care – PPO

## 2020-07-05 ENCOUNTER — Other Ambulatory Visit: Payer: Self-pay

## 2020-07-05 VITALS — BP 118/82 | HR 82 | Temp 97.2°F

## 2020-07-05 DIAGNOSIS — Z9884 Bariatric surgery status: Secondary | ICD-10-CM | POA: Diagnosis not present

## 2020-07-05 DIAGNOSIS — D509 Iron deficiency anemia, unspecified: Secondary | ICD-10-CM

## 2020-07-05 DIAGNOSIS — D508 Other iron deficiency anemias: Secondary | ICD-10-CM | POA: Diagnosis not present

## 2020-07-05 DIAGNOSIS — N920 Excessive and frequent menstruation with regular cycle: Secondary | ICD-10-CM | POA: Insufficient documentation

## 2020-07-05 DIAGNOSIS — E538 Deficiency of other specified B group vitamins: Secondary | ICD-10-CM | POA: Diagnosis not present

## 2020-07-05 LAB — PREGNANCY, URINE: Preg Test, Ur: NEGATIVE

## 2020-07-05 MED ORDER — SODIUM CHLORIDE 0.9 % IV SOLN: 200.0000 mg | Freq: Once | INTRAVENOUS | Status: DC

## 2020-07-05 MED ORDER — IRON SUCROSE 20 MG/ML IV SOLN
200.0000 mg | Freq: Once | INTRAVENOUS | Status: AC
  Administered 2020-07-05: 200 mg via INTRAVENOUS
  Filled 2020-07-05: qty 10

## 2020-07-05 MED ORDER — SODIUM CHLORIDE 0.9 % IV SOLN
Freq: Once | INTRAVENOUS | Status: AC
  Filled 2020-07-05: qty 250

## 2020-07-05 NOTE — Progress Notes (Signed)
Patient received prescribed treatment in clinic. IV Venofer. Tolerated well. Patient stable at discharge. 

## 2020-07-12 ENCOUNTER — Inpatient Hospital Stay: Payer: BC Managed Care – PPO

## 2020-07-12 ENCOUNTER — Other Ambulatory Visit: Payer: Self-pay

## 2020-07-12 VITALS — BP 126/83 | HR 76 | Temp 97.9°F | Resp 18

## 2020-07-12 DIAGNOSIS — D509 Iron deficiency anemia, unspecified: Secondary | ICD-10-CM

## 2020-07-12 DIAGNOSIS — D508 Other iron deficiency anemias: Secondary | ICD-10-CM | POA: Diagnosis not present

## 2020-07-12 DIAGNOSIS — E538 Deficiency of other specified B group vitamins: Secondary | ICD-10-CM | POA: Diagnosis not present

## 2020-07-12 DIAGNOSIS — N920 Excessive and frequent menstruation with regular cycle: Secondary | ICD-10-CM | POA: Diagnosis not present

## 2020-07-12 DIAGNOSIS — Z9884 Bariatric surgery status: Secondary | ICD-10-CM | POA: Diagnosis not present

## 2020-07-12 LAB — PREGNANCY, URINE: Preg Test, Ur: NEGATIVE

## 2020-07-12 MED ORDER — SODIUM CHLORIDE 0.9 % IV SOLN: 200.0000 mg | Freq: Once | INTRAVENOUS | Status: DC

## 2020-07-12 MED ORDER — SODIUM CHLORIDE 0.9 % IV SOLN
INTRAVENOUS | Status: DC
  Filled 2020-07-12: qty 250

## 2020-07-12 MED ORDER — IRON SUCROSE 20 MG/ML IV SOLN
200.0000 mg | Freq: Once | INTRAVENOUS | Status: AC
  Administered 2020-07-12: 200 mg via INTRAVENOUS
  Filled 2020-07-12: qty 10

## 2020-07-12 NOTE — Progress Notes (Signed)
Pt received prescribed treatment in clinic, pt stable at d/c. 

## 2020-07-20 ENCOUNTER — Encounter: Payer: Self-pay | Admitting: Hematology and Oncology

## 2020-07-21 DIAGNOSIS — Z03818 Encounter for observation for suspected exposure to other biological agents ruled out: Secondary | ICD-10-CM | POA: Diagnosis not present

## 2020-07-25 DIAGNOSIS — E119 Type 2 diabetes mellitus without complications: Secondary | ICD-10-CM | POA: Diagnosis not present

## 2020-07-26 ENCOUNTER — Encounter: Payer: Self-pay | Admitting: Hematology and Oncology

## 2020-07-27 ENCOUNTER — Telehealth: Payer: Self-pay

## 2020-07-27 ENCOUNTER — Other Ambulatory Visit: Payer: Self-pay

## 2020-07-27 DIAGNOSIS — E538 Deficiency of other specified B group vitamins: Secondary | ICD-10-CM

## 2020-07-27 DIAGNOSIS — D509 Iron deficiency anemia, unspecified: Secondary | ICD-10-CM

## 2020-07-31 ENCOUNTER — Inpatient Hospital Stay: Payer: BC Managed Care – PPO | Attending: Hematology and Oncology

## 2020-07-31 ENCOUNTER — Encounter: Payer: Self-pay | Admitting: Hematology and Oncology

## 2020-07-31 ENCOUNTER — Other Ambulatory Visit: Payer: Self-pay

## 2020-07-31 DIAGNOSIS — D509 Iron deficiency anemia, unspecified: Secondary | ICD-10-CM | POA: Diagnosis not present

## 2020-07-31 DIAGNOSIS — E538 Deficiency of other specified B group vitamins: Secondary | ICD-10-CM

## 2020-07-31 LAB — CBC
HCT: 36.4 % (ref 36.0–46.0)
Hemoglobin: 11.4 g/dL — ABNORMAL LOW (ref 12.0–15.0)
MCH: 24.2 pg — ABNORMAL LOW (ref 26.0–34.0)
MCHC: 31.3 g/dL (ref 30.0–36.0)
MCV: 77.3 fL — ABNORMAL LOW (ref 80.0–100.0)
Platelets: 291 10*3/uL (ref 150–400)
RBC: 4.71 MIL/uL (ref 3.87–5.11)
RDW: 17.2 % — ABNORMAL HIGH (ref 11.5–15.5)
WBC: 6.2 10*3/uL (ref 4.0–10.5)
nRBC: 0 % (ref 0.0–0.2)

## 2020-07-31 LAB — IRON AND TIBC
Iron: 66 ug/dL (ref 28–170)
Saturation Ratios: 17 % (ref 10.4–31.8)
TIBC: 389 ug/dL (ref 250–450)
UIBC: 323 ug/dL

## 2020-07-31 LAB — FERRITIN: Ferritin: 66 ng/mL (ref 11–307)

## 2020-08-11 DIAGNOSIS — F331 Major depressive disorder, recurrent, moderate: Secondary | ICD-10-CM | POA: Diagnosis not present

## 2020-08-11 DIAGNOSIS — F4011 Social phobia, generalized: Secondary | ICD-10-CM | POA: Diagnosis not present

## 2020-08-22 DIAGNOSIS — E119 Type 2 diabetes mellitus without complications: Secondary | ICD-10-CM | POA: Diagnosis not present

## 2020-08-25 DIAGNOSIS — F4011 Social phobia, generalized: Secondary | ICD-10-CM | POA: Diagnosis not present

## 2020-08-25 DIAGNOSIS — F331 Major depressive disorder, recurrent, moderate: Secondary | ICD-10-CM | POA: Diagnosis not present

## 2020-08-29 DIAGNOSIS — E1169 Type 2 diabetes mellitus with other specified complication: Secondary | ICD-10-CM | POA: Diagnosis not present

## 2020-08-29 DIAGNOSIS — E559 Vitamin D deficiency, unspecified: Secondary | ICD-10-CM | POA: Diagnosis not present

## 2020-08-29 DIAGNOSIS — E669 Obesity, unspecified: Secondary | ICD-10-CM | POA: Diagnosis not present

## 2020-08-31 DIAGNOSIS — E669 Obesity, unspecified: Secondary | ICD-10-CM | POA: Diagnosis not present

## 2020-08-31 DIAGNOSIS — E1169 Type 2 diabetes mellitus with other specified complication: Secondary | ICD-10-CM | POA: Diagnosis not present

## 2020-08-31 LAB — BASIC METABOLIC PANEL
BUN: 17 (ref 4–21)
Chloride: 103 (ref 99–108)
Creatinine: 0.8 (ref 0.5–1.1)
Glucose: 116
Potassium: 4.5 (ref 3.4–5.3)
Sodium: 135 — AB (ref 137–147)

## 2020-08-31 LAB — MICROALBUMIN, URINE: Microalb, Ur: 212

## 2020-08-31 LAB — HEMOGLOBIN A1C: Hemoglobin A1C: 6.9

## 2020-08-31 LAB — COMPREHENSIVE METABOLIC PANEL: Calcium: 9.3 (ref 8.7–10.7)

## 2020-09-05 DIAGNOSIS — E669 Obesity, unspecified: Secondary | ICD-10-CM | POA: Diagnosis not present

## 2020-09-05 DIAGNOSIS — E782 Mixed hyperlipidemia: Secondary | ICD-10-CM | POA: Diagnosis not present

## 2020-09-05 DIAGNOSIS — E559 Vitamin D deficiency, unspecified: Secondary | ICD-10-CM | POA: Diagnosis not present

## 2020-09-05 DIAGNOSIS — E1169 Type 2 diabetes mellitus with other specified complication: Secondary | ICD-10-CM | POA: Diagnosis not present

## 2020-09-18 ENCOUNTER — Other Ambulatory Visit: Payer: BC Managed Care – PPO

## 2020-09-18 NOTE — Progress Notes (Signed)
[facility]  [address], [address] [place], [place] [address] Phone: [phone]  Fax: [phone]   Clinic Day:  09/19/2020  Referring physician: [name], [name], MD  Chief Complaint: Jeanette Flores is a 45 y.o. female s/p gastric bypass (2008) with iron deficiency anemia who is seen for 6 month reassessment.   HPI: The patient was last seen in the hematology clinic on 03/20/2020. At that time, she had been fatigued x 1 month.  She had heavy menses.  She had shortness of breath on exertion (stairs). She received Venofer.  Bilateral screening mammogram on 06/20/2020 revealed scattered areas of fibroglandular density. There were no suspicious masses, calcifications, or other findings in either breast.  Labs followed: 06/19/2020: Hematocrit 36.4, hemoglobin 11.3, MCV 76.3, platelets 351,000, WBC 7,400. Ferritin 12. Iron saturation   8%. TIBC 461. 07/31/2020: Hematocrit 36.4, hemoglobin 11.4, MCV 77.3, platelets 291,000, WBC 6,200. Ferritin 66. Iron saturation 17%. TIBC 389.  She received Venofer weekly x 3 (06/21/2020 - 07/12/2020).  During the interim, she feels "good." She feels 90%. She still gets tired and short of breath on exertion sometimes. Her diet is good. She has headaches. She gets dizzy if she gets up too quickly. She denies any bleeding besides heavy periods. She denies black stools, restless legs, and any cravings.  The patient is interested in Von Willebrand testing. Her period is scheduled to begin in the next week or so.  She takes oral B12. She takes baby aspirin everyday.   Past Medical History:  Diagnosis Date  . Anemia   . Hypertension     Past Surgical History:  Procedure Laterality Date  . Gastric bypass surgery      Family History  Problem Relation Age of Onset  . Diabetes Father   . Diabetes Paternal Grandmother     Social History:  reports that she has never smoked. She has never used smokeless tobacco. She reports  that she does not drink alcohol and does not use drugs.She is married with no children. During COVID-19 she works from home for [employer] and [employer]; she is a Production designer, theatre/television/filmmanager. She denies any exposure to toxins.  [place] in [place].  The patient is alone today.  Allergies:  Allergies  Allergen Reactions  . Azithromycin Hives    Current Medications: Current Outpatient Medications  Medication Sig Dispense Refill  . aspirin 81 MG tablet Take 1 tablet (81 mg total) by mouth daily. 30 tablet 11  . ergocalciferol (VITAMIN D2) 1.25 MG (50000 UT) capsule TAKE 1 CAPSULE BY MOUTH 1 TIME A WEEK    . ferrous sulfate 325 (65 FE) MG tablet Take 325 mg by mouth daily with breakfast. otc    . losartan-hydrochlorothiazide (HYZAAR) 100-12.5 MG tablet Take 1 tablet by mouth daily. 90 tablet 1  . metFORMIN (GLUCOPHAGE XR) 500 MG 24 hr tablet Take 1 tablet (500 mg total) by mouth daily with breakfast. (Patient taking differently: Take 1,500 mg by mouth at bedtime. Dr Tedd SiasSolum) 90 tablet 1  . OZEMPIC 0.25 or 0.5 MG/DOSE SOPN Inject 0.5 mg as directed once a week. Dr Tedd SiasSolum  1  . atorvastatin (LIPITOR) 20 MG tablet Take 1 tablet by mouth daily. Solum    . fluticasone (FLONASE) 50 MCG/ACT nasal spray Place 2 sprays into both nostrils daily. (Patient not taking: Reported on 09/19/2020) 16 g 11   No current facility-administered medications for this visit.    Review of Systems  Constitutional: Positive for malaise/fatigue (occasional). Negative for  chills, diaphoresis, fever and weight loss.       Feels 90%.  HENT: Negative.  Negative for congestion, ear discharge, ear pain, hearing loss, nosebleeds, sinus pain, sore throat and tinnitus.   Eyes: Negative.  Negative for blurred vision, double vision, photophobia and pain.  Respiratory: Positive for shortness of breath (on exertion). Negative for cough, hemoptysis and sputum production.   Cardiovascular: Negative.  Negative for chest pain, palpitations and leg  swelling.  Gastrointestinal: Negative.  Negative for abdominal pain, blood in stool, constipation, diarrhea, heartburn, melena, nausea and vomiting.       No ice pica. Good diet.  Genitourinary: Negative for dysuria, frequency, hematuria and urgency.       Heavy menses.  Musculoskeletal: Negative.  Negative for back pain, joint pain, myalgias and neck pain.  Skin: Negative.  Negative for itching and rash.  Neurological: Positive for dizziness (upon standing to quick) and headaches (yesterday). Negative for tingling, sensory change and weakness.       No restless legs.  Endo/Heme/Allergies: Negative.  Does not bruise/bleed easily.  Psychiatric/Behavioral: Negative.  Negative for depression and memory loss. The patient is not nervous/anxious and does not have insomnia.   All other systems reviewed and are negative.  Performance status (ECOG): 1  Vital Signs  Blood pressure (!) 132/95, pulse 87, temperature (!) 97 F (36.1 C), temperature source Tympanic, resp. rate 18, weight 282 lb 3 oz (128 kg), SpO2 100 %.  Physical Exam Vitals and nursing note reviewed.  Constitutional:      General: She is not in acute distress.    Appearance: She is well-developed. She is not diaphoretic.     Interventions: Face mask in place.  HENT:     Head: Normocephalic and atraumatic.     Comments: Black hair.    Mouth/Throat:     Pharynx: No oropharyngeal exudate.  Eyes:     General: No scleral icterus.    Conjunctiva/sclera: Conjunctivae normal.     Pupils: Pupils are equal, round, and reactive to light.     Comments: Glasses.  Brown eyes.  Cardiovascular:     Rate and Rhythm: Normal rate and regular rhythm.     Heart sounds: Normal heart sounds. No murmur heard.   Pulmonary:     Effort: Pulmonary effort is normal. No respiratory distress.     Breath sounds: Normal breath sounds.  Chest:  Breasts:     Right: No axillary adenopathy or supraclavicular adenopathy.     Left: No axillary adenopathy  or supraclavicular adenopathy.    Abdominal:     General: Bowel sounds are normal.     Palpations: Abdomen is soft.     Tenderness: There is no abdominal tenderness.  Musculoskeletal:        General: No tenderness. Normal range of motion.     Cervical back: Normal range of motion and neck supple.  Lymphadenopathy:     Head:     Right side of head: No preauricular, posterior auricular or occipital adenopathy.     Left side of head: No preauricular, posterior auricular or occipital adenopathy.     Cervical: No cervical adenopathy.     Upper Body:     Right upper body: No supraclavicular or axillary adenopathy.     Left upper body: No supraclavicular or axillary adenopathy.     Lower Body: No right inguinal adenopathy. No left inguinal adenopathy.  Skin:    General: Skin is warm and dry.  Neurological:  Mental Status: She is alert and oriented to person, place, and time.     Deep Tendon Reflexes: Reflexes are normal and symmetric.  Psychiatric:        Behavior: Behavior normal.        Thought Content: Thought content normal.        Judgment: Judgment normal.    Appointment on 09/19/2020  Component Date Value Ref Range Status  . Preg Test, Ur 09/19/2020 NEGATIVE  NEGATIVE Final   Performed at [facility], [address], [place], [place] [address]  . WBC 09/19/2020 7.8  4.0 - 10.5 K/uL Final  . RBC 09/19/2020 4.86  3.87 - 5.11 MIL/uL Final  . Hemoglobin 09/19/2020 11.7* 12.0 - 15.0 g/dL Final  . HCT 12/45/8099 37.6  36.0 - 46.0 % Final  . MCV 09/19/2020 77.4* 80.0 - 100.0 fL Final  . MCH 09/19/2020 24.1* 26.0 - 34.0 pg Final  . MCHC 09/19/2020 31.1  30.0 - 36.0 g/dL Final  . RDW 83/38/2505 17.2* 11.5 - 15.5 % Final  . Platelets 09/19/2020 383  150 - 400 K/uL Final  . nRBC 09/19/2020 0.0  0.0 - 0.2 % Final  . Neutrophils Relative % 09/19/2020 68  % Final  . Neutro Abs 09/19/2020 5.3  1.7 - 7.7 K/uL Final  . Lymphocytes Relative 09/19/2020 19  % Final  .  Lymphs Abs 09/19/2020 1.5  0.7 - 4.0 K/uL Final  . Monocytes Relative 09/19/2020 8  % Final  . Monocytes Absolute 09/19/2020 0.6  0.1 - 1.0 K/uL Final  . Eosinophils Relative 09/19/2020 4  % Final  . Eosinophils Absolute 09/19/2020 0.3  0.0 - 0.5 K/uL Final  . Basophils Relative 09/19/2020 1  % Final  . Basophils Absolute 09/19/2020 0.1  0.0 - 0.1 K/uL Final  . Immature Granulocytes 09/19/2020 0  % Final  . Abs Immature Granulocytes 09/19/2020 0.02  0.00 - 0.07 K/uL Final   Performed at [facility], [address], [place], [place] [address]    Assessment:  Kasen Sako is a 45 y.o. female s/p gastric bypass surgery (2008) with iron deficiency anemia.  She has menorrhagia.  Diet is good.  She has no pica.  Work-up on 12/23/2018 revealed hematocrit 35.2, hemoglobin 11.4, MCV 79.1, platelets 322,000, WBC 8,900. Ferritin was 18 with an iron saturation of 10% and a TIBC of 451. Vitamin B12 was 304 and folate 12.4.   She received Feraheme on 11/10/2015, 06/26/2018, and 12/24/2018.  She received Venofer weekly x 3 (06/21/2020 - 07/12/2020).  She is taking oral iron with vitamin C.  She is on oral B12.  Ferritin has been followed: 9 on 11/11/2011, 25 on 08/23/2013, 33 on 02/17/2015, 36 on 11/10/2015, 44 on 12/04/2016, 22 on 06/22/2018, 18 on 12/24/2018, 12 on 06/19/2020, and 66 on 07/31/2020.   Symptomatically,  she feels "good." She still gets tired and short of breath on exertion sometimes. Her diet is good. She gets dizzy if she gets up too quickly. She denies any bleeding besides heavy periods. She denies black stools, restless legs, and any cravings.  Plan: 1.   Labs today: CBC with diff, ferritin, iron studies. 2.   Iron deficiency anemia  She is s/p gastric bypass surgery.  Hematocrit 37.6.  Hemoglobin 11.7.  MCV 77.4 on 09/19/2020.    Ferritin 14 with an iron saturation of 26% and a TIBC of 434 (available after clinic).   Ferritin goal is 100.  Patient receives IV iron  if  ferritin is < 30 to prevent symptoms.  Patient was contacted after clinic for Venofer x 3. 3.   Menorrhagia  Patient has heavy menses.  We will contact the clinic with her next heavy menses for additional testing (von Willebrand panel, PT, PTT, platelet function assay). 4.   B12 deficiency  B12 was 956 on 03/01/2020.  She is on oral B12.   Check folate annually. 5.   RN:  Call patient with ferritin and if IV iron needed. 6.   When patient calls with heavy menses (von Willebrand panel, PT, PTT, platelet function assay). 7.   RTC in 3 months for labs (CBC with diff, ferritin, iron studies). 8.   RTC in 6 months for MD assessment, labs (CBC with diff, ferritin-day before) and +/- Venofer.  I discussed the assessment and treatment plan with the patient.  The patient was provided an opportunity to ask questions and all were answered.  The patient agreed with the plan and demonstrated an understanding of the instructions.  The patient was advised to call back if the symptoms worsen or if the condition fails to improve as anticipated.  I provided 13 minutes of face-to-face time during this this encounter and > 50% was spent counseling as documented under my assessment and plan.  An additional 6 minutes were spent reviewing her chart (Epic and Care Everywhere) including notes, labs, and imaging studies.    Rosey Bath, MD, PhD    09/19/2020, 1:53 PM  I, Danella Penton Tufford, am acting as Neurosurgeon for [employer]. Merlene Pulling, MD, PhD.  I, Idalee Foxworthy C. Merlene Pulling, MD, have reviewed the above documentation for accuracy and completeness, and I agree with the above.

## 2020-09-19 ENCOUNTER — Inpatient Hospital Stay: Payer: BC Managed Care – PPO | Attending: Hematology and Oncology | Admitting: Hematology and Oncology

## 2020-09-19 ENCOUNTER — Other Ambulatory Visit: Payer: Self-pay

## 2020-09-19 ENCOUNTER — Inpatient Hospital Stay: Payer: BC Managed Care – PPO

## 2020-09-19 ENCOUNTER — Encounter: Payer: Self-pay | Admitting: Hematology and Oncology

## 2020-09-19 VITALS — BP 132/95 | HR 87 | Temp 97.0°F | Resp 18 | Wt 282.2 lb

## 2020-09-19 DIAGNOSIS — Z7982 Long term (current) use of aspirin: Secondary | ICD-10-CM | POA: Diagnosis not present

## 2020-09-19 DIAGNOSIS — Z7984 Long term (current) use of oral hypoglycemic drugs: Secondary | ICD-10-CM | POA: Diagnosis not present

## 2020-09-19 DIAGNOSIS — E538 Deficiency of other specified B group vitamins: Secondary | ICD-10-CM | POA: Diagnosis not present

## 2020-09-19 DIAGNOSIS — Z9884 Bariatric surgery status: Secondary | ICD-10-CM | POA: Diagnosis not present

## 2020-09-19 DIAGNOSIS — D508 Other iron deficiency anemias: Secondary | ICD-10-CM

## 2020-09-19 DIAGNOSIS — I10 Essential (primary) hypertension: Secondary | ICD-10-CM | POA: Insufficient documentation

## 2020-09-19 DIAGNOSIS — N920 Excessive and frequent menstruation with regular cycle: Secondary | ICD-10-CM

## 2020-09-19 DIAGNOSIS — K9589 Other complications of other bariatric procedure: Secondary | ICD-10-CM

## 2020-09-19 DIAGNOSIS — Z79899 Other long term (current) drug therapy: Secondary | ICD-10-CM | POA: Diagnosis not present

## 2020-09-19 LAB — FERRITIN: Ferritin: 14 ng/mL (ref 11–307)

## 2020-09-19 LAB — CBC WITH DIFFERENTIAL/PLATELET
Abs Immature Granulocytes: 0.02 10*3/uL (ref 0.00–0.07)
Basophils Absolute: 0.1 10*3/uL (ref 0.0–0.1)
Basophils Relative: 1 %
Eosinophils Absolute: 0.3 10*3/uL (ref 0.0–0.5)
Eosinophils Relative: 4 %
HCT: 37.6 % (ref 36.0–46.0)
Hemoglobin: 11.7 g/dL — ABNORMAL LOW (ref 12.0–15.0)
Immature Granulocytes: 0 %
Lymphocytes Relative: 19 %
Lymphs Abs: 1.5 10*3/uL (ref 0.7–4.0)
MCH: 24.1 pg — ABNORMAL LOW (ref 26.0–34.0)
MCHC: 31.1 g/dL (ref 30.0–36.0)
MCV: 77.4 fL — ABNORMAL LOW (ref 80.0–100.0)
Monocytes Absolute: 0.6 10*3/uL (ref 0.1–1.0)
Monocytes Relative: 8 %
Neutro Abs: 5.3 10*3/uL (ref 1.7–7.7)
Neutrophils Relative %: 68 %
Platelets: 383 10*3/uL (ref 150–400)
RBC: 4.86 MIL/uL (ref 3.87–5.11)
RDW: 17.2 % — ABNORMAL HIGH (ref 11.5–15.5)
WBC: 7.8 10*3/uL (ref 4.0–10.5)
nRBC: 0 % (ref 0.0–0.2)

## 2020-09-19 LAB — PREGNANCY, URINE: Preg Test, Ur: NEGATIVE

## 2020-09-19 LAB — IRON AND TIBC
Iron: 112 ug/dL (ref 28–170)
Saturation Ratios: 26 % (ref 10.4–31.8)
TIBC: 434 ug/dL (ref 250–450)
UIBC: 322 ug/dL

## 2020-09-19 NOTE — Progress Notes (Signed)
Patient reports some fatigue and nausea.

## 2020-09-20 ENCOUNTER — Inpatient Hospital Stay: Payer: BC Managed Care – PPO

## 2020-09-20 ENCOUNTER — Telehealth: Payer: Self-pay | Admitting: *Deleted

## 2020-09-20 VITALS — BP 119/88 | HR 85 | Temp 97.0°F | Resp 18

## 2020-09-20 DIAGNOSIS — Z9884 Bariatric surgery status: Secondary | ICD-10-CM | POA: Diagnosis not present

## 2020-09-20 DIAGNOSIS — D508 Other iron deficiency anemias: Secondary | ICD-10-CM | POA: Diagnosis not present

## 2020-09-20 DIAGNOSIS — Z7982 Long term (current) use of aspirin: Secondary | ICD-10-CM | POA: Diagnosis not present

## 2020-09-20 DIAGNOSIS — Z79899 Other long term (current) drug therapy: Secondary | ICD-10-CM | POA: Diagnosis not present

## 2020-09-20 DIAGNOSIS — I10 Essential (primary) hypertension: Secondary | ICD-10-CM | POA: Diagnosis not present

## 2020-09-20 DIAGNOSIS — Z7984 Long term (current) use of oral hypoglycemic drugs: Secondary | ICD-10-CM | POA: Diagnosis not present

## 2020-09-20 DIAGNOSIS — E538 Deficiency of other specified B group vitamins: Secondary | ICD-10-CM | POA: Diagnosis not present

## 2020-09-20 DIAGNOSIS — D509 Iron deficiency anemia, unspecified: Secondary | ICD-10-CM

## 2020-09-20 DIAGNOSIS — N920 Excessive and frequent menstruation with regular cycle: Secondary | ICD-10-CM | POA: Diagnosis not present

## 2020-09-20 MED ORDER — SODIUM CHLORIDE 0.9 % IV SOLN: 200.0000 mg | Freq: Once | INTRAVENOUS | Status: DC

## 2020-09-20 MED ORDER — SODIUM CHLORIDE 0.9 % IV SOLN
Freq: Once | INTRAVENOUS | Status: AC
  Filled 2020-09-20: qty 250

## 2020-09-20 MED ORDER — IRON SUCROSE 20 MG/ML IV SOLN
200.0000 mg | Freq: Once | INTRAVENOUS | Status: AC
  Administered 2020-09-20: 200 mg via INTRAVENOUS
  Filled 2020-09-20: qty 10

## 2020-09-20 NOTE — Telephone Encounter (Signed)
-----   Message from Rosey Bath, MD sent at 09/20/2020  6:38 AM EDT -----  Please call patient.  Ferritin 14 (low).  Consider weekly Venofer x 3 with urine pregnancy testing.  M  ----- Message ----- From: Leory Plowman, [facility] [place] Sent: 09/19/2020   1:38 PM EDT To: Rosey Bath, MD

## 2020-09-20 NOTE — Telephone Encounter (Signed)
09/20/2020 Additional 2 venofer appts scheduled w/ pt upon check in this afternoon  SRW

## 2020-09-20 NOTE — Telephone Encounter (Signed)
Stephanie/ Brooke - Please set up 2 more iron infusions with weekly lab for urine pregnancy test  Patient has an apt today for iv iron. Please provider pt with these additional appointments when she comes in to clinic.

## 2020-09-22 DIAGNOSIS — E119 Type 2 diabetes mellitus without complications: Secondary | ICD-10-CM | POA: Diagnosis not present

## 2020-09-22 LAB — HM DIABETES EYE EXAM

## 2020-09-27 ENCOUNTER — Other Ambulatory Visit: Payer: Self-pay

## 2020-09-27 ENCOUNTER — Inpatient Hospital Stay: Payer: BC Managed Care – PPO | Attending: Hematology and Oncology

## 2020-09-27 VITALS — BP 135/73 | HR 75 | Temp 97.5°F | Resp 18

## 2020-09-27 DIAGNOSIS — D508 Other iron deficiency anemias: Secondary | ICD-10-CM | POA: Insufficient documentation

## 2020-09-27 DIAGNOSIS — E538 Deficiency of other specified B group vitamins: Secondary | ICD-10-CM | POA: Diagnosis not present

## 2020-09-27 DIAGNOSIS — D509 Iron deficiency anemia, unspecified: Secondary | ICD-10-CM

## 2020-09-27 MED ORDER — SODIUM CHLORIDE 0.9 % IV SOLN
Freq: Once | INTRAVENOUS | Status: AC
Start: 2020-09-27 — End: 2020-09-27
  Filled 2020-09-27: qty 250

## 2020-09-27 MED ORDER — SODIUM CHLORIDE 0.9 % IV SOLN: 200.0000 mg | Freq: Once | INTRAVENOUS | Status: DC

## 2020-09-27 MED ORDER — IRON SUCROSE 20 MG/ML IV SOLN
200.0000 mg | Freq: Once | INTRAVENOUS | Status: AC
  Administered 2020-09-27: 200 mg via INTRAVENOUS
  Filled 2020-09-27: qty 10

## 2020-09-27 NOTE — Progress Notes (Signed)
Patient tolerated Venofer infusion well, no concerns voiced. Patient discharged. Stable. 

## 2020-10-04 ENCOUNTER — Other Ambulatory Visit: Payer: Self-pay

## 2020-10-04 ENCOUNTER — Inpatient Hospital Stay: Payer: BC Managed Care – PPO

## 2020-10-04 ENCOUNTER — Ambulatory Visit: Payer: BC Managed Care – PPO

## 2020-10-04 VITALS — BP 125/85 | HR 85 | Temp 96.3°F

## 2020-10-04 DIAGNOSIS — E538 Deficiency of other specified B group vitamins: Secondary | ICD-10-CM | POA: Diagnosis not present

## 2020-10-04 DIAGNOSIS — D508 Other iron deficiency anemias: Secondary | ICD-10-CM | POA: Diagnosis not present

## 2020-10-04 DIAGNOSIS — D509 Iron deficiency anemia, unspecified: Secondary | ICD-10-CM

## 2020-10-04 LAB — PREGNANCY, URINE: Preg Test, Ur: NEGATIVE

## 2020-10-04 MED ORDER — IRON SUCROSE 20 MG/ML IV SOLN
200.0000 mg | Freq: Once | INTRAVENOUS | Status: AC
  Administered 2020-10-04: 200 mg via INTRAVENOUS
  Filled 2020-10-04: qty 10

## 2020-10-04 MED ORDER — SODIUM CHLORIDE 0.9 % IV SOLN
Freq: Once | INTRAVENOUS | Status: AC
  Filled 2020-10-04: qty 250

## 2020-10-04 MED ORDER — SODIUM CHLORIDE 0.9 % IV SOLN: 200.0000 mg | Freq: Once | INTRAVENOUS | Status: DC

## 2020-10-22 DIAGNOSIS — E119 Type 2 diabetes mellitus without complications: Secondary | ICD-10-CM | POA: Diagnosis not present

## 2020-11-22 DIAGNOSIS — E119 Type 2 diabetes mellitus without complications: Secondary | ICD-10-CM | POA: Diagnosis not present

## 2020-12-07 ENCOUNTER — Ambulatory Visit: Payer: BC Managed Care – PPO | Admitting: Family Medicine

## 2020-12-10 ENCOUNTER — Other Ambulatory Visit: Payer: Self-pay | Admitting: Family Medicine

## 2020-12-10 DIAGNOSIS — Z6841 Body Mass Index (BMI) 40.0 and over, adult: Secondary | ICD-10-CM | POA: Diagnosis not present

## 2020-12-10 DIAGNOSIS — M25562 Pain in left knee: Secondary | ICD-10-CM | POA: Diagnosis not present

## 2020-12-10 DIAGNOSIS — G44229 Chronic tension-type headache, not intractable: Secondary | ICD-10-CM

## 2020-12-11 ENCOUNTER — Ambulatory Visit: Payer: BC Managed Care – PPO | Admitting: Family Medicine

## 2020-12-17 ENCOUNTER — Other Ambulatory Visit: Payer: Self-pay

## 2020-12-17 DIAGNOSIS — D509 Iron deficiency anemia, unspecified: Secondary | ICD-10-CM

## 2020-12-17 DIAGNOSIS — D508 Other iron deficiency anemias: Secondary | ICD-10-CM

## 2020-12-17 DIAGNOSIS — K9589 Other complications of other bariatric procedure: Secondary | ICD-10-CM

## 2020-12-17 DIAGNOSIS — N920 Excessive and frequent menstruation with regular cycle: Secondary | ICD-10-CM

## 2020-12-17 DIAGNOSIS — E538 Deficiency of other specified B group vitamins: Secondary | ICD-10-CM

## 2020-12-21 ENCOUNTER — Other Ambulatory Visit: Payer: Self-pay

## 2020-12-21 ENCOUNTER — Inpatient Hospital Stay: Payer: BC Managed Care – PPO | Attending: Oncology

## 2020-12-21 DIAGNOSIS — E538 Deficiency of other specified B group vitamins: Secondary | ICD-10-CM

## 2020-12-21 DIAGNOSIS — K9589 Other complications of other bariatric procedure: Secondary | ICD-10-CM | POA: Diagnosis not present

## 2020-12-21 DIAGNOSIS — D508 Other iron deficiency anemias: Secondary | ICD-10-CM | POA: Insufficient documentation

## 2020-12-21 LAB — CBC WITH DIFFERENTIAL/PLATELET
Abs Immature Granulocytes: 0.02 10*3/uL (ref 0.00–0.07)
Basophils Absolute: 0.1 10*3/uL (ref 0.0–0.1)
Basophils Relative: 1 %
Eosinophils Absolute: 0.4 10*3/uL (ref 0.0–0.5)
Eosinophils Relative: 5 %
HCT: 38.2 % (ref 36.0–46.0)
Hemoglobin: 12.2 g/dL (ref 12.0–15.0)
Immature Granulocytes: 0 %
Lymphocytes Relative: 27 %
Lymphs Abs: 1.9 10*3/uL (ref 0.7–4.0)
MCH: 24.4 pg — ABNORMAL LOW (ref 26.0–34.0)
MCHC: 31.9 g/dL (ref 30.0–36.0)
MCV: 76.6 fL — ABNORMAL LOW (ref 80.0–100.0)
Monocytes Absolute: 0.6 10*3/uL (ref 0.1–1.0)
Monocytes Relative: 8 %
Neutro Abs: 4.2 10*3/uL (ref 1.7–7.7)
Neutrophils Relative %: 59 %
Platelets: 343 10*3/uL (ref 150–400)
RBC: 4.99 MIL/uL (ref 3.87–5.11)
RDW: 17.1 % — ABNORMAL HIGH (ref 11.5–15.5)
WBC: 7.1 10*3/uL (ref 4.0–10.5)
nRBC: 0 % (ref 0.0–0.2)

## 2020-12-21 LAB — IRON AND TIBC
Iron: 42 ug/dL (ref 28–170)
Saturation Ratios: 10 % — ABNORMAL LOW (ref 10.4–31.8)
TIBC: 438 ug/dL (ref 250–450)
UIBC: 396 ug/dL

## 2020-12-21 LAB — FERRITIN: Ferritin: 33 ng/mL (ref 11–307)

## 2020-12-22 DIAGNOSIS — E119 Type 2 diabetes mellitus without complications: Secondary | ICD-10-CM | POA: Diagnosis not present

## 2021-01-22 DIAGNOSIS — E119 Type 2 diabetes mellitus without complications: Secondary | ICD-10-CM | POA: Diagnosis not present

## 2021-01-23 ENCOUNTER — Encounter: Payer: Self-pay | Admitting: Family Medicine

## 2021-01-24 ENCOUNTER — Telehealth (INDEPENDENT_AMBULATORY_CARE_PROVIDER_SITE_OTHER): Payer: BC Managed Care – PPO | Admitting: Family Medicine

## 2021-01-24 ENCOUNTER — Encounter: Payer: Self-pay | Admitting: Family Medicine

## 2021-01-24 VITALS — Temp 98.6°F

## 2021-01-24 DIAGNOSIS — Z20822 Contact with and (suspected) exposure to covid-19: Secondary | ICD-10-CM | POA: Diagnosis not present

## 2021-01-24 DIAGNOSIS — U071 COVID-19: Secondary | ICD-10-CM | POA: Diagnosis not present

## 2021-01-24 MED ORDER — MOLNUPIRAVIR EUA 200MG CAPSULE: 4.0000 | ORAL_CAPSULE | Freq: Two times a day (BID) | ORAL | 0 refills | Status: AC

## 2021-01-24 NOTE — Progress Notes (Addendum)
Date:  01/24/2021   Name:  Jeanette Flores   DOB:  1975/07/16   MRN:  [id number]   Chief Complaint: Covid Positive (Symptoms started on 01/21/21 with cough. Tested positive for COVID yesterday. Fever yesterday was 101. No fever today. Having headache)  I Anne Fu, MD from my office connected with this patient, Mr. Jeanette Flores, by telephone at the patient's home.  I verified that I am speaking with the correct person using two identifiers. This visit was conducted via telephone due to the Covid-19 outbreak from my office at [facility] in [place], [place]. I discussed the limitations, risks, security and privacy concerns of performing an evaluation and management service by telephone. I also discussed with the patient that there may be a patient responsible charge related to this service. The patient expressed understanding and agreed to proceed.    URI  This is a new problem. The current episode started in the past 7 days. The problem has been gradually worsening. There has been no fever. The fever has been present for Less than 1 day. Associated symptoms include congestion, coughing, headaches, sneezing and a sore throat. Pertinent negatives include no abdominal pain, chest pain, diarrhea, dysuria, ear pain, joint pain, joint swelling, nausea, neck pain, rash, rhinorrhea, sinus pain, swollen glands, vomiting or wheezing. She has tried NSAIDs and antihistamine for the symptoms. The treatment provided moderate relief.   Lab Results  Component Value Date   CREATININE 0.91 06/07/2020   BUN 8 06/07/2020   NA 137 06/07/2020   K 4.7 06/07/2020   CL 99 06/07/2020   CO2 20 06/07/2020   Lab Results  Component Value Date   CHOL 156 06/07/2020   HDL 36 (L) 06/07/2020   LDLCALC 102 (H) 06/07/2020   TRIG 98 06/07/2020   CHOLHDL 5.7 (H) 07/02/2017   No results found for: TSH Lab Results  Component Value Date   HGBA1C 6.9 08/31/2020   Lab Results  Component Value Date   WBC 7.1 12/21/2020    HGB 12.2 12/21/2020   HCT 38.2 12/21/2020   MCV 76.6 (L) 12/21/2020   PLT 343 12/21/2020   No results found for: ALT, AST, GGT, ALKPHOS, BILITOT   Review of Systems  HENT:  Positive for congestion, sneezing and sore throat. Negative for ear pain, rhinorrhea and sinus pain.   Respiratory:  Positive for cough. Negative for wheezing.   Cardiovascular:  Negative for chest pain.  Gastrointestinal:  Negative for abdominal pain, diarrhea, nausea and vomiting.  Genitourinary:  Negative for dysuria.  Musculoskeletal:  Negative for joint pain and neck pain.  Skin:  Negative for rash.  Neurological:  Positive for headaches.   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   Menorrhagia with regular cycle 01/10/2019   B12 deficiency 01/10/2019   Morbid obesity (HCC) 07/02/2017   Hypertension associated with diabetes (HCC) 07/02/2017   Dyslipidemia 07/02/2017   Iron deficiency anemia 11/08/2015   Essential hypertension 11/02/2015   Type 2 diabetes mellitus without complication, without long-term current use of insulin (HCC) 11/02/2015   Hyperlipidemia 11/02/2015   Overweight 11/02/2015    Allergies  Allergen Reactions   Azithromycin Hives    Past Surgical History:  Procedure Laterality Date   Gastric bypass surgery      Social History   Tobacco Use   Smoking status: Never   Smokeless tobacco: Never  Substance Use Topics   Alcohol use: No    Alcohol/week: 0.0 standard  drinks   Drug use: No     Medication list has been reviewed and updated.  Current Meds  Medication Sig   aspirin 81 MG tablet Take 1 tablet (81 mg total) by mouth daily.   atorvastatin (LIPITOR) 20 MG tablet Take 1 tablet by mouth daily. Solum   ergocalciferol (VITAMIN D2) 1.25 MG (50000 UT) capsule TAKE 1 CAPSULE BY MOUTH 1 TIME A WEEK   ferrous sulfate 325 (65 FE) MG tablet Take 325 mg by mouth daily with breakfast. otc   fluticasone (FLONASE) 50 MCG/ACT nasal spray  SHAKE LIQUID AND USE 2 SPRAYS IN EACH NOSTRIL DAILY   losartan-hydrochlorothiazide (HYZAAR) 100-12.5 MG tablet Take 1 tablet by mouth daily.   metFORMIN (GLUCOPHAGE XR) 500 MG 24 hr tablet Take 1 tablet (500 mg total) by mouth daily with breakfast. (Patient taking differently: Take 1,500 mg by mouth at bedtime. Dr Tedd Sias)   OZEMPIC 0.25 or 0.5 MG/DOSE SOPN Inject 0.5 mg as directed once a week. Dr Tedd Sias    [facility] 2/9 Scores 01/24/2021 06/07/2020 10/26/2018 06/30/2017  PHQ - 2 Score 0 0 0 0  PHQ- 9 Score 0 5 1 0    GAD 7 : Generalized Anxiety Score 01/24/2021 06/07/2020  Nervous, Anxious, on Edge 0 1  Control/stop worrying 0 0  Worry too much - different things 0 0  Trouble relaxing 0 1  Restless 0 0  Easily annoyed or irritable 0 1  Afraid - awful might happen 0 0  Total GAD 7 Score 0 3  Anxiety Difficulty - Somewhat difficult    BP Readings from Last 3 Encounters:  10/04/20 125/85  09/27/20 135/73  09/20/20 119/88    Physical Exam  Wt Readings from Last 3 Encounters:  09/19/20 282 lb 3 oz (128 kg)  06/07/20 281 lb (127.5 kg)  03/20/20 281 lb 8.4 oz (127.7 kg)    Temp 98.6 F (37 C) (Oral)   Assessment and Plan:  1. COVID-19 Acute.  Persistent.  Stable.  At 48 hours patient continues to have headache but cough is decreasing.  Patient has a risk score of 3 but with a continuous headache we will initiate molnupiravir 200 mg 4 capsules twice a day for 5 days.  Patient has been given information on symptoms to expect and warned of exacerbations or worsening of concerns for which she needs to seek medical/emergency care.  Patient is also been suggested to pick up Mucinex DM for the cough and Tylenol for muscle aches and fever if it should develop. - molnupiravir EUA 200 mg CAPS; Take 4 capsules (800 mg total) by mouth 2 (two) times daily for 5 days.  Dispense: 40 capsule; Refill: 0   I spent 10 minutes with this patient, More than 50% of that time was spent in face to face education,  counseling and care coordination.

## 2021-01-24 NOTE — Patient Instructions (Signed)
COVID-19: What to Do if You Are Sick CDC has updated isolation and quarantine recommendations for the public, and is revising the CDC website to reflect these changes. These recommendations do not apply to healthcare personnel and do not supersede state, local, tribal, or territorial laws, rules, andregulations. If you have a fever, cough or other symptoms, you might have COVID-19. Most people have mild illness and are able to recover at home. If you are sick: Keep track of your symptoms. If you have an emergency warning sign (including trouble breathing), call 911. Steps to help prevent the spread of COVID-19 if you are sick If you are sick with COVID-19 or think you might have COVID-19, follow the steps below to care for yourself and to help protect other peoplein your home and community. Stay home except to get medical care Stay home. Most people with COVID-19 have mild illness and can recover at home without medical care. Do not leave your home, except to get medical care. Do not visit public areas. Take care of yourself. Get rest and stay hydrated. Take over-the-counter medicines, such as acetaminophen, to help you feel better. Stay in touch with your doctor. Call before you get medical care. Be sure to get care if you have trouble breathing, or have any other emergency warning signs, or if you think it is an emergency. Avoid public transportation, ride-sharing, or taxis. Separate yourself from other people As much as possible, stay in a specific room and away from other people and pets in your home. If possible, you should use a separate bathroom. If you need to be around other people or animals in oroutside of the home, wear a mask. Tell your close contactsthat they may have been exposed to COVID-19. An infected person can spread COVID-19 starting 48 hours (or 2 days) before the person has any symptoms or tests positive. By letting your close contacts know they may have been exposed to COVID-19,  you are helping to protect everyone. Additional guidance is available for those living in close quarters and shared housing. See COVID-19 and Animals if you have questions about pets. If you are diagnosed with COVID-19, someone from the health department may call you. Answer the call to slow the spread. Monitor your symptoms Symptoms of COVID-19 include fever, cough, or other symptoms. Follow care instructions from your healthcare provider and local health department. Your local health authorities may give instructions on checking your symptoms and reporting information. When to seek emergency medical attention Look for emergency warning signs* for COVID-19. If someone is showing any of these signs, seek emergency medical care immediately: Trouble breathing Persistent pain or pressure in the chest New confusion Inability to wake or stay awake Pale, gray, or blue-colored skin, lips, or nail beds, depending on skin tone *This list is not all possible symptoms. Please call your medical provider forany other symptoms that are severe or concerning to you. Call 911 or call ahead to your local emergency facility: Notify the operator that you are seeking care for someone who has or may haveCOVID-19. Call ahead before visiting your doctor Call ahead. Many medical visits for routine care are being postponed or done by phone or telemedicine. If you have a medical appointment that cannot be postponed, call your doctor's office, and tell them you have or may have COVID-19. This will help the office protect themselves and other patients. Get tested If you have symptoms of COVID-19, get tested. While waiting for test results, you stay away from others,   including staying apart from those living in your household. Self-tests are one of several options for testing for the virus that causes COVID-19 and may be more convenient than laboratory-based tests and point-of-care tests. Ask your healthcare provider or  your local health department if you need help interpreting your test results. You can visit your state, tribal, local, and territorial health department's website to look for the latest local information on testing sites. If you are sick, wear a mask over your nose and mouth You should wear a mask over your nose and mouth if you must be around other people or animals, including pets (even at home). You don't need to wear the mask if you are alone. If you can't put on a mask (because of trouble breathing, for example), cover your coughs and sneezes in some other way. Try to stay at least 6 feet away from other people. This will help protect the people around you. Masks should not be placed on young children under age 2 years, anyone who has trouble breathing, or anyone who is not able to remove the mask without help. Note: During the COVID-19 pandemic, medical grade facemasks are reserved forhealthcare workers and some first responders. Cover your coughs and sneezes Cover your mouth and nose with a tissue when you cough or sneeze. Throw away used tissues in a lined trash can. Immediately wash your hands with soap and water for at least 20 seconds. If soap and water are not available, clean your hands with an alcohol-based hand sanitizer that contains at least 60% alcohol. Clean your hands often Wash your hands often with soap and water for at least 20 seconds. This is especially important after blowing your nose, coughing, or sneezing; going to the bathroom; and before eating or preparing food. Use hand sanitizer if soap and water are not available. Use an alcohol-based hand sanitizer with at least 60% alcohol, covering all surfaces of your hands and rubbing them together until they feel dry. Soap and water are the best option, especially if hands are visibly dirty. Avoid touching your eyes, nose, and mouth with unwashed hands. Handwashing Tips Avoid sharing personal household items Do not share  dishes, drinking glasses, cups, eating utensils, towels, or bedding with other people in your home. Wash these items thoroughly after using them with soap and water or put in the dishwasher. Clean all "high-touch" surfaces every day Clean and disinfect high-touch surfaces in your "sick room" and bathroom; wear disposable gloves. Let someone else clean and disinfect surfaces in common areas, but you should clean your bedroom and bathroom, if possible. If a caregiver or other person needs to clean and disinfect a sick person's bedroom or bathroom, they should do so on an as-needed basis. The caregiver/other person should wear a mask and disposable gloves prior to cleaning. They should wait as long as possible after the person who is sick has used the bathroom before coming in to clean and use the bathroom. High-touch surfaces include phones, remote controls, counters, tabletops, doorknobs, bathroom fixtures, toilets, keyboards, tablets, and bedside tables. Clean and disinfect areas that may have blood, stool, or body fluids on them. Use household cleaners and disinfectants. Clean the area or item with soap and water or another detergent if it is dirty. Then, use a household disinfectant. Be sure to follow the instructions on the label to ensure safe and effective use of the product. Many products recommend keeping the surface wet for several minutes to ensure germs are killed. Many   also recommend precautions such as wearing gloves and making sure you have good ventilation during use of the product. Use a product from EPA's List N: Disinfectants for Coronavirus (COVID-19). Complete Disinfection Guidance When you can be around others after being sick with COVID-19 Deciding when you can be around others is different for different situations. Find out when you can safely end home isolation. For any additional questions about your care,contact your healthcare provider or state or local health  department. 05/31/2020 Content source: National Center for Immunization and Respiratory Diseases (NCIRD), Division of Viral Diseases This information is not intended to replace advice given to you by your health care provider. Make sure you discuss any questions you have with your healthcare provider. Document Revised: 07/28/2020 Document Reviewed: 07/28/2020 Elsevier Patient Education  2022 [employer] Inc.  

## 2021-01-29 ENCOUNTER — Other Ambulatory Visit: Payer: Self-pay | Admitting: Family Medicine

## 2021-01-29 DIAGNOSIS — I152 Hypertension secondary to endocrine disorders: Secondary | ICD-10-CM

## 2021-01-29 DIAGNOSIS — E1159 Type 2 diabetes mellitus with other circulatory complications: Secondary | ICD-10-CM

## 2021-02-19 ENCOUNTER — Other Ambulatory Visit: Payer: Self-pay

## 2021-02-19 ENCOUNTER — Ambulatory Visit (INDEPENDENT_AMBULATORY_CARE_PROVIDER_SITE_OTHER): Payer: BC Managed Care – PPO | Admitting: Family Medicine

## 2021-02-19 ENCOUNTER — Encounter: Payer: Self-pay | Admitting: Family Medicine

## 2021-02-19 VITALS — BP 110/70 | HR 72 | Ht 66.0 in | Wt 275.0 lb

## 2021-02-19 DIAGNOSIS — E1159 Type 2 diabetes mellitus with other circulatory complications: Secondary | ICD-10-CM | POA: Diagnosis not present

## 2021-02-19 DIAGNOSIS — E782 Mixed hyperlipidemia: Secondary | ICD-10-CM

## 2021-02-19 DIAGNOSIS — J301 Allergic rhinitis due to pollen: Secondary | ICD-10-CM | POA: Diagnosis not present

## 2021-02-19 DIAGNOSIS — I152 Hypertension secondary to endocrine disorders: Secondary | ICD-10-CM

## 2021-02-19 MED ORDER — LOSARTAN POTASSIUM-HCTZ 100-12.5 MG PO TABS: 1.0000 | ORAL_TABLET | Freq: Every day | ORAL | 1 refills | Status: DC

## 2021-02-19 MED ORDER — FLUTICASONE PROPIONATE 50 MCG/ACT NA SUSP: NASAL | 5 refills | Status: DC

## 2021-02-19 NOTE — Progress Notes (Signed)
Date:  02/19/2021   Name:  Jeanette Flores   DOB:  1976/05/22   MRN:  [id number]   Chief Complaint: Hypertension and Allergic Rhinitis   Hypertension This is a chronic problem. The current episode started more than 1 year ago. The problem has been gradually improving since onset. The problem is controlled. Pertinent negatives include no anxiety, blurred vision, chest pain, headaches, malaise/fatigue, neck pain, orthopnea, palpitations, peripheral edema, PND, shortness of breath or sweats. There are no associated agents to hypertension. Past treatments include angiotensin blockers and diuretics. The current treatment provides moderate improvement. There are no compliance problems.  There is no history of angina, kidney disease, CAD/MI, CVA, heart failure, left ventricular hypertrophy, PVD or retinopathy. There is no history of chronic renal disease, a hypertension causing med or renovascular disease.  URI  This is a chronic (for allergic rhinitis) problem. The current episode started more than 1 year ago. The problem has been gradually improving. Associated symptoms include coughing and a plugged ear sensation. Pertinent negatives include no abdominal pain, chest pain, congestion, diarrhea, dysuria, ear pain, headaches, joint pain, joint swelling, nausea, neck pain, rash, rhinorrhea, sinus pain, sneezing, sore throat or wheezing. She has tried inhaler use for the symptoms. The treatment provided moderate relief.   Lab Results  Component Value Date   CREATININE 0.8 08/31/2020   BUN 17 08/31/2020   NA 135 (A) 08/31/2020   K 4.5 08/31/2020   CL 103 08/31/2020   CO2 20 06/07/2020   Lab Results  Component Value Date   CHOL 156 06/07/2020   HDL 36 (L) 06/07/2020   LDLCALC 102 (H) 06/07/2020   TRIG 98 06/07/2020   CHOLHDL 5.7 (H) 07/02/2017   No results found for: TSH Lab Results  Component Value Date   HGBA1C 6.9 08/31/2020   Lab Results  Component Value Date   WBC 7.1 12/21/2020    HGB 12.2 12/21/2020   HCT 38.2 12/21/2020   MCV 76.6 (L) 12/21/2020   PLT 343 12/21/2020   No results found for: ALT, AST, GGT, ALKPHOS, BILITOT   Review of Systems  Constitutional:  Negative for chills, fever and malaise/fatigue.  HENT:  Negative for congestion, drooling, ear discharge, ear pain, rhinorrhea, sinus pain, sneezing and sore throat.   Eyes:  Negative for blurred vision.  Respiratory:  Positive for cough. Negative for shortness of breath and wheezing.   Cardiovascular:  Negative for chest pain, palpitations, orthopnea, leg swelling and PND.  Gastrointestinal:  Negative for abdominal pain, blood in stool, constipation, diarrhea and nausea.  Endocrine: Negative for polydipsia.  Genitourinary:  Negative for dysuria, frequency, hematuria and urgency.  Musculoskeletal:  Negative for back pain, joint pain, myalgias and neck pain.  Skin:  Negative for rash.  Allergic/Immunologic: Negative for environmental allergies.  Neurological:  Negative for dizziness and headaches.  Hematological:  Does not bruise/bleed easily.  Psychiatric/Behavioral:  Negative for suicidal ideas. The patient is not nervous/anxious.    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   Menorrhagia with regular cycle 01/10/2019   B12 deficiency 01/10/2019   Morbid obesity (HCC) 07/02/2017   Hypertension associated with diabetes (HCC) 07/02/2017   Dyslipidemia 07/02/2017   Iron deficiency anemia 11/08/2015   Essential hypertension 11/02/2015   Type 2 diabetes mellitus without complication, without long-term current use of insulin (HCC) 11/02/2015   Hyperlipidemia 11/02/2015   Overweight 11/02/2015    Allergies  Allergen Reactions   Azithromycin Hives  Past Surgical History:  Procedure Laterality Date   Gastric bypass surgery      Social History   Tobacco Use   Smoking status: Never   Smokeless tobacco: Never  Substance Use Topics   Alcohol use: No     Alcohol/week: 0.0 standard drinks   Drug use: No     Medication list has been reviewed and updated.  Current Meds  Medication Sig   aspirin 81 MG tablet Take 1 tablet (81 mg total) by mouth daily.   atorvastatin (LIPITOR) 20 MG tablet Take 1 tablet by mouth daily. Solum   ergocalciferol (VITAMIN D2) 1.25 MG (50000 UT) capsule TAKE 1 CAPSULE BY MOUTH 1 TIME A WEEK   ferrous sulfate 325 (65 FE) MG tablet Take 325 mg by mouth daily with breakfast. otc   fluticasone (FLONASE) 50 MCG/ACT nasal spray SHAKE LIQUID AND USE 2 SPRAYS IN EACH NOSTRIL DAILY   losartan-hydrochlorothiazide (HYZAAR) 100-12.5 MG tablet TAKE 1 TABLET BY MOUTH DAILY   metFORMIN (GLUCOPHAGE XR) 500 MG 24 hr tablet Take 1 tablet (500 mg total) by mouth daily with breakfast. (Patient taking differently: Take 1,500 mg by mouth at bedtime. Dr Tedd Sias)   OZEMPIC 0.25 or 0.5 MG/DOSE SOPN Inject 0.5 mg as directed once a week. Dr Tedd Sias    [facility] 2/9 Scores 02/19/2021 01/24/2021 06/07/2020 10/26/2018  PHQ - 2 Score 0 0 0 0  PHQ- 9 Score 0 0 5 1    GAD 7 : Generalized Anxiety Score 02/19/2021 01/24/2021 06/07/2020  Nervous, Anxious, on Edge 1 0 1  Control/stop worrying 0 0 0  Worry too much - different things 0 0 0  Trouble relaxing 0 0 1  Restless 0 0 0  Easily annoyed or irritable 0 0 1  Afraid - awful might happen 0 0 0  Total GAD 7 Score 1 0 3  Anxiety Difficulty Not difficult at all - Somewhat difficult    BP Readings from Last 3 Encounters:  02/19/21 110/70  10/04/20 125/85  09/27/20 135/73    Physical Exam Vitals and nursing note reviewed.  Constitutional:      General: She is not in acute distress.    Appearance: She is not diaphoretic.  HENT:     Head: Normocephalic and atraumatic.     Right Ear: Tympanic membrane, ear canal and external ear normal.     Left Ear: Tympanic membrane, ear canal and external ear normal.     Nose: Nose normal. No congestion or rhinorrhea.  Eyes:     General:        Right eye: No  discharge.        Left eye: No discharge.     Conjunctiva/sclera: Conjunctivae normal.     Pupils: Pupils are equal, round, and reactive to light.  Neck:     Thyroid: No thyromegaly.     Vascular: No carotid bruit or JVD.  Cardiovascular:     Rate and Rhythm: Normal rate and regular rhythm.     Heart sounds: Normal heart sounds. No murmur heard.   No friction rub. No gallop.  Pulmonary:     Effort: Pulmonary effort is normal.     Breath sounds: Normal breath sounds. No wheezing or rhonchi.  Abdominal:     General: Bowel sounds are normal.     Palpations: Abdomen is soft. There is no mass.     Tenderness: There is no abdominal tenderness. There is no guarding.  Musculoskeletal:  General: Normal range of motion.     Cervical back: Normal range of motion and neck supple.  Lymphadenopathy:     Cervical: No cervical adenopathy.  Skin:    General: Skin is warm and dry.  Neurological:     Mental Status: She is alert.     Deep Tendon Reflexes: Reflexes are normal and symmetric.    Wt Readings from Last 3 Encounters:  02/19/21 275 lb (124.7 kg)  09/19/20 282 lb 3 oz (128 kg)  06/07/20 281 lb (127.5 kg)    BP 110/70   Pulse 72   Ht 5\' 6"  (1.676 m)   Wt 275 lb (124.7 kg)   LMP 02/03/2021 (Exact Date)   BMI 44.39 kg/m   Assessment and Plan:  1. Hypertension associated with diabetes (HCC) .  Controlled.  Stable.  Blood pressure 110/70.  Continue losartan hydrochlorothiazide 100-12.5 mg.  Will check renal function panel for electrolytes and GFR. - losartan-hydrochlorothiazide (HYZAAR) 100-12.5 MG tablet; Take 1 tablet by mouth daily.  Dispense: 90 tablet; Refill: 1 - Renal Function Panel  2. Mixed hyperlipidemia .  Controlled by diet.  Stable.  Will will likely continue dietary control pending lipid panel eval. - Lipid Panel With LDL/HDL Ratio  3. Seasonal allergic rhinitis due to pollen Seasonal.  Controlled.  Stable.  Continue Flonase 2 sprays each nostril daily. -  fluticasone (FLONASE) 50 MCG/ACT nasal spray; SHAKE LIQUID AND USE 2 SPRAYS IN EACH NOSTRIL DAILY  Dispense: 16 g; Refill: 5

## 2021-02-20 LAB — RENAL FUNCTION PANEL
Albumin: 4 g/dL (ref 3.8–4.8)
BUN/Creatinine Ratio: 12 (ref 9–23)
BUN: 10 mg/dL (ref 6–24)
CO2: 24 mmol/L (ref 20–29)
Calcium: 9 mg/dL (ref 8.7–10.2)
Chloride: 101 mmol/L (ref 96–106)
Creatinine, Ser: 0.83 mg/dL (ref 0.57–1.00)
Glucose: 96 mg/dL (ref 65–99)
Phosphorus: 3.6 mg/dL (ref 3.0–4.3)
Potassium: 4.6 mmol/L (ref 3.5–5.2)
Sodium: 137 mmol/L (ref 134–144)
eGFR: 90 mL/min/{1.73_m2} (ref >59)

## 2021-02-20 LAB — LIPID PANEL WITH LDL/HDL RATIO
Cholesterol, Total: 122 mg/dL (ref 100–199)
HDL: 31 mg/dL — ABNORMAL LOW (ref >39)
LDL Chol Calc (NIH): 75 mg/dL (ref 0–99)
LDL/HDL Ratio: 2.4 ratio (ref 0.0–3.2)
Triglycerides: 80 mg/dL (ref 0–149)
VLDL Cholesterol Cal: 16 mg/dL (ref 5–40)

## 2021-02-22 DIAGNOSIS — E119 Type 2 diabetes mellitus without complications: Secondary | ICD-10-CM | POA: Diagnosis not present

## 2021-03-06 DIAGNOSIS — E669 Obesity, unspecified: Secondary | ICD-10-CM | POA: Diagnosis not present

## 2021-03-06 DIAGNOSIS — E782 Mixed hyperlipidemia: Secondary | ICD-10-CM | POA: Diagnosis not present

## 2021-03-06 DIAGNOSIS — E559 Vitamin D deficiency, unspecified: Secondary | ICD-10-CM | POA: Diagnosis not present

## 2021-03-06 DIAGNOSIS — E1169 Type 2 diabetes mellitus with other specified complication: Secondary | ICD-10-CM | POA: Diagnosis not present

## 2021-03-06 LAB — LIPID PANEL
Cholesterol: 127 (ref 0–200)
HDL: 30 — AB (ref 35–70)
LDL Cholesterol: 76
Triglycerides: 106 (ref 40–160)

## 2021-03-06 LAB — BASIC METABOLIC PANEL
BUN: 15 (ref 4–21)
Chloride: 103 (ref 99–108)
Creatinine: 0.8 (ref 0.5–1.1)
Glucose: 98
Potassium: 4.6 (ref 3.4–5.3)
Sodium: 138 (ref 137–147)

## 2021-03-06 LAB — HM DIABETES FOOT EXAM: HM Diabetic Foot Exam: NORMAL

## 2021-03-06 LAB — COMPREHENSIVE METABOLIC PANEL: Calcium: 9.3 (ref 8.7–10.7)

## 2021-03-06 LAB — HEMOGLOBIN A1C: Hemoglobin A1C: 7

## 2021-03-10 ENCOUNTER — Other Ambulatory Visit: Payer: Self-pay | Admitting: Family Medicine

## 2021-03-10 DIAGNOSIS — I152 Hypertension secondary to endocrine disorders: Secondary | ICD-10-CM

## 2021-03-10 NOTE — Telephone Encounter (Signed)
Called Pharmacy and spoke with Jeanette Flores who stated to dc this request

## 2021-03-13 DIAGNOSIS — E1169 Type 2 diabetes mellitus with other specified complication: Secondary | ICD-10-CM | POA: Diagnosis not present

## 2021-03-13 DIAGNOSIS — E782 Mixed hyperlipidemia: Secondary | ICD-10-CM | POA: Diagnosis not present

## 2021-03-13 DIAGNOSIS — E669 Obesity, unspecified: Secondary | ICD-10-CM | POA: Diagnosis not present

## 2021-03-13 DIAGNOSIS — E559 Vitamin D deficiency, unspecified: Secondary | ICD-10-CM | POA: Diagnosis not present

## 2021-03-24 DIAGNOSIS — E119 Type 2 diabetes mellitus without complications: Secondary | ICD-10-CM | POA: Diagnosis not present

## 2021-03-26 ENCOUNTER — Other Ambulatory Visit: Payer: BC Managed Care – PPO

## 2021-03-27 ENCOUNTER — Ambulatory Visit: Payer: BC Managed Care – PPO | Admitting: Internal Medicine

## 2021-03-27 ENCOUNTER — Ambulatory Visit: Payer: BC Managed Care – PPO

## 2021-04-24 DIAGNOSIS — E119 Type 2 diabetes mellitus without complications: Secondary | ICD-10-CM | POA: Diagnosis not present

## 2021-05-24 DIAGNOSIS — E119 Type 2 diabetes mellitus without complications: Secondary | ICD-10-CM | POA: Diagnosis not present

## 2021-06-11 ENCOUNTER — Encounter: Payer: BC Managed Care – PPO | Admitting: Family Medicine

## 2021-06-24 DIAGNOSIS — E119 Type 2 diabetes mellitus without complications: Secondary | ICD-10-CM | POA: Diagnosis not present

## 2021-07-19 DIAGNOSIS — R21 Rash and other nonspecific skin eruption: Secondary | ICD-10-CM | POA: Diagnosis not present

## 2021-07-25 DIAGNOSIS — E119 Type 2 diabetes mellitus without complications: Secondary | ICD-10-CM | POA: Diagnosis not present

## 2021-08-10 ENCOUNTER — Other Ambulatory Visit: Payer: Self-pay

## 2021-08-10 DIAGNOSIS — I152 Hypertension secondary to endocrine disorders: Secondary | ICD-10-CM

## 2021-08-10 DIAGNOSIS — E1159 Type 2 diabetes mellitus with other circulatory complications: Secondary | ICD-10-CM

## 2021-08-10 MED ORDER — LOSARTAN POTASSIUM-HCTZ 100-12.5 MG PO TABS: 1.0000 | ORAL_TABLET | Freq: Every day | ORAL | 0 refills | Status: DC

## 2021-08-21 ENCOUNTER — Telehealth: Payer: Self-pay | Admitting: Oncology

## 2021-08-21 ENCOUNTER — Other Ambulatory Visit: Payer: Self-pay | Admitting: Family Medicine

## 2021-08-21 DIAGNOSIS — Z1231 Encounter for screening mammogram for malignant neoplasm of breast: Secondary | ICD-10-CM

## 2021-08-21 NOTE — Telephone Encounter (Signed)
Pt called and stated she is feeling weak and tired. She is also feeling cold and wanted to schedule an appt for lab and to see Dr. Woodfin Ganja . How would you like to proceed?

## 2021-08-22 ENCOUNTER — Other Ambulatory Visit: Payer: Self-pay

## 2021-08-22 ENCOUNTER — Encounter: Payer: Self-pay | Admitting: Family Medicine

## 2021-08-22 ENCOUNTER — Ambulatory Visit (INDEPENDENT_AMBULATORY_CARE_PROVIDER_SITE_OTHER): Payer: BC Managed Care – PPO | Admitting: Family Medicine

## 2021-08-22 ENCOUNTER — Other Ambulatory Visit: Payer: Self-pay | Admitting: Emergency Medicine

## 2021-08-22 VITALS — BP 100/70 | HR 64 | Ht 65.0 in | Wt 268.0 lb

## 2021-08-22 DIAGNOSIS — I152 Hypertension secondary to endocrine disorders: Secondary | ICD-10-CM | POA: Diagnosis not present

## 2021-08-22 DIAGNOSIS — E119 Type 2 diabetes mellitus without complications: Secondary | ICD-10-CM | POA: Diagnosis not present

## 2021-08-22 DIAGNOSIS — E1159 Type 2 diabetes mellitus with other circulatory complications: Secondary | ICD-10-CM | POA: Diagnosis not present

## 2021-08-22 DIAGNOSIS — K9589 Other complications of other bariatric procedure: Secondary | ICD-10-CM

## 2021-08-22 DIAGNOSIS — D508 Other iron deficiency anemias: Secondary | ICD-10-CM

## 2021-08-22 DIAGNOSIS — J301 Allergic rhinitis due to pollen: Secondary | ICD-10-CM | POA: Diagnosis not present

## 2021-08-22 MED ORDER — FLUTICASONE PROPIONATE 50 MCG/ACT NA SUSP: NASAL | 5 refills | Status: DC

## 2021-08-22 MED ORDER — LOSARTAN POTASSIUM-HCTZ 100-12.5 MG PO TABS: 1.0000 | ORAL_TABLET | Freq: Every day | ORAL | 1 refills | Status: DC

## 2021-08-22 NOTE — Progress Notes (Signed)
Date:  08/22/2021   Name:  Jeanette Flores   DOB:  1976/03/21   MRN:  [id number]   Chief Complaint: Hypertension and Allergic Rhinitis   Hypertension This is a chronic problem. The current episode started more than 1 year ago. The problem has been gradually improving since onset. The problem is controlled. Pertinent negatives include no anxiety, blurred vision, chest pain, headaches, malaise/fatigue, neck pain, orthopnea, palpitations, peripheral edema, PND, shortness of breath or sweats. There are no associated agents to hypertension. Risk factors for coronary artery disease include diabetes mellitus, dyslipidemia and obesity. Past treatments include angiotensin blockers and diuretics. The current treatment provides moderate improvement. There are no compliance problems.  There is no history of angina, kidney disease, CAD/MI, CVA, heart failure, left ventricular hypertrophy or PVD. There is no history of chronic renal disease, a hypertension causing med or renovascular disease.  URI  This is a recurrent (for allergic rhinnitis/sinusitis) problem. The current episode started more than 1 year ago. The problem has been waxing and waning. There has been no fever. Associated symptoms include congestion and rhinorrhea. Pertinent negatives include no abdominal pain, chest pain, coughing, diarrhea, dysuria, ear pain, headaches, nausea, neck pain, rash, sinus pain, sore throat or wheezing. Treatments tried: flonase. The treatment provided moderate relief.   Lab Results  Component Value Date   NA 138 03/06/2021   K 4.6 03/06/2021   CO2 24 02/19/2021   GLUCOSE 96 02/19/2021   BUN 15 03/06/2021   CREATININE 0.8 03/06/2021   CALCIUM 9.3 03/06/2021   EGFR 90 02/19/2021   GFRNONAA 78 06/07/2020   Lab Results  Component Value Date   CHOL 127 03/06/2021   HDL 30 (A) 03/06/2021   LDLCALC 76 03/06/2021   TRIG 106 03/06/2021   CHOLHDL 5.7 (H) 07/02/2017   No results found for: TSH Lab Results   Component Value Date   HGBA1C 7.0 03/06/2021   Lab Results  Component Value Date   WBC 7.1 12/21/2020   HGB 12.2 12/21/2020   HCT 38.2 12/21/2020   MCV 76.6 (L) 12/21/2020   PLT 343 12/21/2020   No results found for: ALT, AST, GGT, ALKPHOS, BILITOT No results found for: 25OHVITD2, 25OHVITD3, VD25OH   Review of Systems  Constitutional:  Negative for chills, fever and malaise/fatigue.  HENT:  Positive for congestion and rhinorrhea. Negative for drooling, ear discharge, ear pain, sinus pain and sore throat.   Eyes:  Negative for blurred vision.  Respiratory:  Negative for cough, shortness of breath and wheezing.   Cardiovascular:  Negative for chest pain, palpitations, orthopnea, leg swelling and PND.  Gastrointestinal:  Negative for abdominal pain, blood in stool, constipation, diarrhea and nausea.  Endocrine: Negative for polydipsia.  Genitourinary:  Negative for dysuria, frequency, hematuria and urgency.  Musculoskeletal:  Negative for back pain, myalgias and neck pain.  Skin:  Negative for rash.  Allergic/Immunologic: Negative for environmental allergies.  Neurological:  Negative for dizziness and headaches.  Hematological:  Does not bruise/bleed easily.  Psychiatric/Behavioral:  Negative for suicidal ideas. The patient is not nervous/anxious.    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   Menorrhagia with regular cycle 01/10/2019   B12 deficiency 01/10/2019   Morbid obesity ([place]) 07/02/2017   Hypertension associated with diabetes ([place]) 07/02/2017   Dyslipidemia 07/02/2017   Iron deficiency anemia 11/08/2015   Essential hypertension 11/02/2015   Type 2 diabetes mellitus without complication, without long-term current use of insulin ([place]) 11/02/2015  Hyperlipidemia 11/02/2015   Overweight 11/02/2015    Allergies  Allergen Reactions   Azithromycin Hives    Past Surgical History:  Procedure Laterality Date   Gastric bypass  surgery      Social History   Tobacco Use   Smoking status: Never   Smokeless tobacco: Never  Substance Use Topics   Alcohol use: No    Alcohol/week: 0.0 standard drinks   Drug use: No     Medication list has been reviewed and updated.  Current Meds  Medication Sig   aspirin 81 MG tablet Take 1 tablet (81 mg total) by mouth daily.   atorvastatin (LIPITOR) 20 MG tablet Take 1 tablet by mouth daily. Solum   ergocalciferol (VITAMIN D2) 1.25 MG (50000 UT) capsule TAKE 1 CAPSULE BY MOUTH 1 TIME A WEEK   ferrous sulfate 325 (65 FE) MG tablet Take 325 mg by mouth daily with breakfast. otc   fluticasone (FLONASE) 50 MCG/ACT nasal spray SHAKE LIQUID AND USE 2 SPRAYS IN EACH NOSTRIL DAILY   losartan-hydrochlorothiazide (HYZAAR) 100-12.5 MG tablet Take 1 tablet by mouth daily.   metFORMIN (GLUCOPHAGE XR) 500 MG 24 hr tablet Take 1 tablet (500 mg total) by mouth daily with breakfast. (Patient taking differently: Take 1,500 mg by mouth at bedtime. Dr Gabriel Carina)   tirzepatide [facility]) 5 MG/0.5ML Pen Inject into the skin. endo   [DISCONTINUED] OZEMPIC 0.25 or 0.5 MG/DOSE SOPN Inject 0.5 mg as directed once a week. Dr Gabriel Carina    [facility] 2/9 Scores 02/19/2021 01/24/2021 06/07/2020 10/26/2018  PHQ - 2 Score 0 0 0 0  PHQ- 9 Score 0 0 5 1    GAD 7 : Generalized Anxiety Score 02/19/2021 01/24/2021 06/07/2020  Nervous, Anxious, on Edge 1 0 1  Control/stop worrying 0 0 0  Worry too much - different things 0 0 0  Trouble relaxing 0 0 1  Restless 0 0 0  Easily annoyed or irritable 0 0 1  Afraid - awful might happen 0 0 0  Total GAD 7 Score 1 0 3  Anxiety Difficulty Not difficult at all - Somewhat difficult    BP Readings from Last 3 Encounters:  08/22/21 100/70  02/19/21 110/70  10/04/20 125/85    Physical Exam Vitals reviewed.  Constitutional:      Appearance: She is well-developed.  HENT:     Head: Normocephalic.     Right Ear: Tympanic membrane, ear canal and external ear normal.     Left Ear:  Tympanic membrane, ear canal and external ear normal.     Nose:     Right Turbinates: Not swollen.     Left Turbinates: Not swollen.     Mouth/Throat:     Mouth: Mucous membranes are moist.  Eyes:     General: Lids are everted, no foreign bodies appreciated. No scleral icterus.       Left eye: No foreign body or hordeolum.     Conjunctiva/sclera: Conjunctivae normal.     Right eye: Right conjunctiva is not injected.     Left eye: Left conjunctiva is not injected.     Pupils: Pupils are equal, round, and reactive to light.  Neck:     Thyroid: No thyromegaly.     Vascular: No JVD.     Trachea: No tracheal deviation.  Cardiovascular:     Rate and Rhythm: Normal rate and regular rhythm.     Heart sounds: Normal heart sounds. No murmur heard.   No friction rub. No gallop.  Pulmonary:     Effort: Pulmonary effort is normal. No respiratory distress.     Breath sounds: Normal breath sounds. No wheezing or rales.  Abdominal:     General: Bowel sounds are normal.     Palpations: Abdomen is soft. There is no mass.     Tenderness: There is no abdominal tenderness. There is no guarding or rebound.  Musculoskeletal:        General: No tenderness. Normal range of motion.     Cervical back: Normal range of motion and neck supple.  Lymphadenopathy:     Cervical: No cervical adenopathy.  Skin:    General: Skin is warm.     Findings: No rash.  Neurological:     Mental Status: She is alert.  Psychiatric:        Mood and Affect: Mood is not anxious or depressed.    Wt Readings from Last 3 Encounters:  08/22/21 268 lb (121.6 kg)  02/19/21 275 lb (124.7 kg)  09/19/20 282 lb 3 oz (128 kg)    BP 100/70    Pulse 64    Ht '5\' 5"'  (1.651 m)    Wt 268 lb (121.6 kg)    LMP 08/04/2021 (Exact Date)    BMI 44.60 kg/m   Assessment and Plan:  1. Hypertension associated with diabetes ([place]) Chronic.  Controlled.  Stable.  Doing very well with blood pressure reading today of 100/70.  Continue losartan  hydrochlorothiazide 100-12.5 mg once a day.  Will check renal function panel with return appointment in 6 months. - losartan-hydrochlorothiazide (HYZAAR) 100-12.5 MG tablet; Take 1 tablet by mouth daily.  Dispense: 90 tablet; Refill: 1  2. Seasonal allergic rhinitis due to pollen Chronic.  Recurrent.  Waxing and waning per pollen count.  Patient will continue over-the-counter as well as prescribed Flonase nasal spray 2 sprays each nostril daily. - fluticasone (FLONASE) 50 MCG/ACT nasal spray; SHAKE LIQUID AND USE 2 SPRAYS IN EACH NOSTRIL DAILY  Dispense: 16 g; Refill: 5

## 2021-08-23 ENCOUNTER — Encounter: Payer: Self-pay | Admitting: Oncology

## 2021-08-23 ENCOUNTER — Ambulatory Visit
Admission: RE | Admit: 2021-08-23 | Discharge: 2021-08-23 | Disposition: A | Discharge status: Home / Self Care | Payer: BC Managed Care – PPO | Source: Ambulatory Visit

## 2021-08-23 DIAGNOSIS — Z1231 Encounter for screening mammogram for malignant neoplasm of breast: Secondary | ICD-10-CM

## 2021-08-24 ENCOUNTER — Other Ambulatory Visit: Payer: Self-pay | Admitting: Emergency Medicine

## 2021-08-24 DIAGNOSIS — D508 Other iron deficiency anemias: Secondary | ICD-10-CM

## 2021-08-24 DIAGNOSIS — K9589 Other complications of other bariatric procedure: Secondary | ICD-10-CM

## 2021-08-27 ENCOUNTER — Inpatient Hospital Stay (HOSPITAL_BASED_OUTPATIENT_CLINIC_OR_DEPARTMENT_OTHER): Payer: BC Managed Care – PPO | Admitting: Nurse Practitioner

## 2021-08-27 ENCOUNTER — Inpatient Hospital Stay: Payer: BC Managed Care – PPO | Attending: Nurse Practitioner

## 2021-08-27 ENCOUNTER — Other Ambulatory Visit: Payer: Self-pay

## 2021-08-27 VITALS — BP 121/87 | HR 75 | Temp 97.7°F | Resp 16 | Ht 65.0 in | Wt 272.6 lb

## 2021-08-27 DIAGNOSIS — N920 Excessive and frequent menstruation with regular cycle: Secondary | ICD-10-CM

## 2021-08-27 DIAGNOSIS — I10 Essential (primary) hypertension: Secondary | ICD-10-CM

## 2021-08-27 DIAGNOSIS — D500 Iron deficiency anemia secondary to blood loss (chronic): Secondary | ICD-10-CM | POA: Insufficient documentation

## 2021-08-27 DIAGNOSIS — E538 Deficiency of other specified B group vitamins: Secondary | ICD-10-CM

## 2021-08-27 DIAGNOSIS — Z803 Family history of malignant neoplasm of breast: Secondary | ICD-10-CM

## 2021-08-27 DIAGNOSIS — K9589 Other complications of other bariatric procedure: Secondary | ICD-10-CM

## 2021-08-27 LAB — CBC WITH DIFFERENTIAL/PLATELET
Abs Immature Granulocytes: 0.03 10*3/uL (ref 0.00–0.07)
Basophils Absolute: 0.1 10*3/uL (ref 0.0–0.1)
Basophils Relative: 1 %
Eosinophils Absolute: 0.3 10*3/uL (ref 0.0–0.5)
Eosinophils Relative: 3 %
HCT: 36 % (ref 36.0–46.0)
Hemoglobin: 11.2 g/dL — ABNORMAL LOW (ref 12.0–15.0)
Immature Granulocytes: 0 %
Lymphocytes Relative: 13 %
Lymphs Abs: 1.2 10*3/uL (ref 0.7–4.0)
MCH: 24.4 pg — ABNORMAL LOW (ref 26.0–34.0)
MCHC: 31.1 g/dL (ref 30.0–36.0)
MCV: 78.4 fL — ABNORMAL LOW (ref 80.0–100.0)
Monocytes Absolute: 0.8 10*3/uL (ref 0.1–1.0)
Monocytes Relative: 9 %
Neutro Abs: 7 10*3/uL (ref 1.7–7.7)
Neutrophils Relative %: 74 %
Platelets: 328 10*3/uL (ref 150–400)
RBC: 4.59 MIL/uL (ref 3.87–5.11)
RDW: 17.8 % — ABNORMAL HIGH (ref 11.5–15.5)
WBC: 9.4 10*3/uL (ref 4.0–10.5)
nRBC: 0 % (ref 0.0–0.2)

## 2021-08-27 LAB — IRON AND TIBC
Iron: 86 ug/dL (ref 28–170)
Saturation Ratios: 21 % (ref 10.4–31.8)
TIBC: 410 ug/dL (ref 250–450)
UIBC: 324 ug/dL

## 2021-08-27 LAB — FOLATE: Folate: 11.7 ng/mL (ref >5.9)

## 2021-08-27 LAB — FERRITIN: Ferritin: 16 ng/mL (ref 11–307)

## 2021-08-27 NOTE — Progress Notes (Signed)
Pt reports increased fatigue not relieved by rest and has noticed that she bruises more easily.  ?

## 2021-08-27 NOTE — Progress Notes (Signed)
?[facility] [place] [facility]  ?[address] ?[place], [place] [address] ?Phone: [phone] ? Fax: [phone] ? ? ?Clinic Day:  08/27/2021 ? ?Referring physician: Duanne Limerick, MD ? ?Chief Complaint: Jeanette Flores is a 46 y.o. female s/p gastric bypass (2008) with iron deficiency anemia who is seen for 6 month reassessment.  ? ?HPI: s/p gastric bypass surgery (2008) with iron deficiency anemia.  She has menorrhagia.  Diet is good.  She has no pica. ? ?Work-up on 12/23/2018 revealed hematocrit 35.2, hemoglobin 11.4, MCV 79.1, platelets 322,000, WBC 8,900. Ferritin was 18 with an iron saturation of 10% and a TIBC of 451. Vitamin B12 was 304 and folate 12.4.  ? ?She received Feraheme on 11/10/2015, 06/26/2018, and 12/24/2018.  She received Venofer weekly x 3 (06/21/2020 - 07/12/2020).  She is taking oral iron with vitamin C.  She is on oral B12. ? ?Ferritin has been followed: 9 on 11/11/2011, 25 on 08/23/2013, 33 on 02/17/2015, 36 on 11/10/2015, 44 on 12/04/2016, 22 on 06/22/2018, 18 on 12/24/2018, 12 on 06/19/2020, and 66 on 07/31/2020.  ? ? ?Interval History: Patient is 46 year old female who returns to clinic for complaints of fatigue, shortness of breath with exertion and craving ice. She continues ot have heavy menstrual periods. Suspect she has iron deficiency again. Denies black or blood stool. No leg cramps.  ? ? ?Past Medical History:  ?Diagnosis Date  ? Anemia   ? Hypertension   ? ? ?Past Surgical History:  ?Procedure Laterality Date  ? Gastric bypass surgery    ? ? ?Family History  ?Problem Relation Age of Onset  ? Diabetes Father   ? Breast cancer Maternal Aunt   ? Diabetes Paternal Grandmother   ? ? ?Social History:  reports that she has never smoked. She has never used smokeless tobacco. She reports that she does not drink alcohol and does not use drugs.She is married with no children. During COVID-19 she works from home for [employer] and [employer]; she is a Production designer, theatre/television/film. She denies  any exposure to toxins.  She lives in [place].   ? ?Allergies:  ?Allergies  ?Allergen Reactions  ? Penicillins Hives  ? Azithromycin Hives  ? ? ?Current Medications: ?Current Outpatient Medications  ?Medication Sig Dispense Refill  ? aspirin 81 MG tablet Take 1 tablet (81 mg total) by mouth daily. 30 tablet 11  ? ergocalciferol (VITAMIN D2) 1.25 MG (50000 UT) capsule TAKE 1 CAPSULE BY MOUTH 1 TIME A WEEK    ? ferrous sulfate 325 (65 FE) MG tablet Take 325 mg by mouth daily with breakfast. otc    ? fluticasone (FLONASE) 50 MCG/ACT nasal spray SHAKE LIQUID AND USE 2 SPRAYS IN EACH NOSTRIL DAILY 16 g 5  ? losartan-hydrochlorothiazide (HYZAAR) 100-12.5 MG tablet Take 1 tablet by mouth daily. 90 tablet 1  ? metFORMIN (GLUCOPHAGE XR) 500 MG 24 hr tablet Take 1 tablet (500 mg total) by mouth daily with breakfast. (Patient taking differently: Take 1,500 mg by mouth at bedtime. Dr Tedd Sias) 90 tablet 1  ? tirzepatide [facility]) 5 MG/0.5ML Pen Inject into the skin. endo    ? atorvastatin (LIPITOR) 20 MG tablet Take 1 tablet by mouth daily. Solum    ? ?No current facility-administered medications for this visit.  ? ? ?Review of Systems  ?Constitutional:  Positive for malaise/fatigue. Negative for chills, fever and weight loss.  ?HENT:  Negative for hearing loss, nosebleeds, sore throat and tinnitus.   ?Eyes:  Negative for blurred  vision and double vision.  ?Respiratory:  Negative for cough, hemoptysis, shortness of breath and wheezing.   ?Cardiovascular:  Negative for chest pain, palpitations and leg swelling.  ?Gastrointestinal:  Negative for abdominal pain, blood in stool, constipation, diarrhea, melena, nausea and vomiting.  ?Genitourinary:  Negative for dysuria and urgency.  ?Musculoskeletal:  Negative for back pain, falls, joint pain and myalgias.  ?Skin:  Negative for itching and rash.  ?Neurological:  Negative for dizziness, tingling, sensory change, loss of consciousness, weakness and headaches.  ?Endo/Heme/Allergies:   Negative for environmental allergies. Does not bruise/bleed easily.  ?Psychiatric/Behavioral:  Negative for depression. The patient is not nervous/anxious and does not have insomnia.   ? ?Performance status (ECOG): 1 ? ?Vital Signs  ?Blood pressure 121/87, pulse 75, temperature 97.7 ?F (36.5 ?C), temperature source Tympanic, resp. rate 16, height 5\' 5"  (1.651 m), weight 272 lb 9.6 oz (123.7 kg), last menstrual period 08/04/2021. ? ?Physical Exam ?Constitutional:   ?   Appearance: She is not ill-appearing.  ?Pulmonary:  ?   Effort: No respiratory distress.  ?Skin: ?   General: Skin is dry.  ?   Coloration: Skin is not pale.  ?Neurological:  ?   Mental Status: She is alert and oriented to person, place, and time.  ?Psychiatric:     ?   Mood and Affect: Mood normal.     ?   Behavior: Behavior normal.  ? ?CBC Latest Ref Rng & Units 08/27/2021 12/21/2020 09/19/2020  ?WBC 4.0 - 10.5 K/uL 9.4 7.1 7.8  ?Hemoglobin 12.0 - 15.0 g/dL 11.2(L) 12.2 11.7(L)  ?Hematocrit 36.0 - 46.0 % 36.0 38.2 37.6  ?Platelets 150 - 400 K/uL 328 343 383  ? ?CMP Latest Ref Rng & Units 03/06/2021 02/19/2021 08/31/2020  ?Glucose 65 - 99 mg/dL - 96 -  ?BUN 4 - 21 15 10 17   ?Creatinine 0.5 - 1.1 0.8 0.83 0.8  ?Sodium 137 - 147 138 137 135(A)  ?Potassium 3.4 - 5.3 4.6 4.6 4.5  ?Chloride 99 - 108 103 101 103  ?CO2 20 - 29 mmol/L - 24 -  ?Calcium 8.7 - 10.7 9.3 9.0 9.3  ? ?Iron/TIBC/Ferritin/ %Sat ?   ?Component Value Date/Time  ? IRON 42 12/21/2020 1358  ? IRON 129 08/23/2013 0827  ? TIBC 438 12/21/2020 1358  ? TIBC 432 08/23/2013 0827  ? FERRITIN 33 12/21/2020 1358  ? FERRITIN 25 08/23/2013 0827  ? IRONPCTSAT 10 (L) 12/21/2020 1358  ? IRONPCTSAT 30 08/23/2013 0827  ? ? ? ?Assessment & Plan:  Jeanette Flores is a 46 y.o. female  ? ?Iron Deficiency Anemia- secondary to malabsorption s/p gastric bypass & menorrhagia. Hmg 11.2. Microcytosis. Iron studies pending at time of visit. Last Venofer April 2022. Based on symptoms I suspect she will benefit from IV iron  but will hold on decision making until iron studies have resulted.  ?Menorrhagia- screening for von willebrand and pt/ptt/inr was previously recommended. Patient has not had drawn. Can consider in the future. I recommended she see gynecology for evaluation.  ?B12 Deficiency- was 956 in 2021. On oral b12 though I question absorption given hx of gastric bypass. Will check b12 and folate in the future.  ? ?Disposition:  ? 3 mo- lab (cbc, cmp, ferritin, iron, b12) ?Day to week later see MD to establish care. Can be virtual if patient prefers. If in person, +/- venofer- la  ? ?I discussed the assessment and treatment plan with the patient.  The patient was provided an opportunity to ask  questions and all were answered.  The patient agreed with the plan and demonstrated an understanding of the instructions.  The patient was advised to call back if the symptoms worsen or if the condition fails to improve as anticipated. ? ?Consuello Masse, DNP, AGNP-C ?[facility] ?[phone] (clinic) ?08/27/2021 ? ?

## 2021-08-28 ENCOUNTER — Encounter: Payer: Self-pay | Admitting: Nurse Practitioner

## 2021-08-30 ENCOUNTER — Other Ambulatory Visit: Payer: Self-pay

## 2021-08-30 ENCOUNTER — Inpatient Hospital Stay: Payer: BC Managed Care – PPO

## 2021-08-30 VITALS — BP 120/82 | HR 90 | Temp 97.8°F | Resp 16

## 2021-08-30 DIAGNOSIS — D500 Iron deficiency anemia secondary to blood loss (chronic): Secondary | ICD-10-CM | POA: Diagnosis not present

## 2021-08-30 DIAGNOSIS — D509 Iron deficiency anemia, unspecified: Secondary | ICD-10-CM

## 2021-08-30 DIAGNOSIS — E538 Deficiency of other specified B group vitamins: Secondary | ICD-10-CM | POA: Diagnosis not present

## 2021-08-30 DIAGNOSIS — N920 Excessive and frequent menstruation with regular cycle: Secondary | ICD-10-CM | POA: Diagnosis not present

## 2021-08-30 MED ORDER — SODIUM CHLORIDE 0.9 % IV SOLN
Freq: Once | INTRAVENOUS | Status: AC
  Filled 2021-08-30: qty 250

## 2021-08-30 MED ORDER — IRON SUCROSE 20 MG/ML IV SOLN
200.0000 mg | Freq: Once | INTRAVENOUS | Status: AC
  Administered 2021-08-30: 14:00:00 200 mg via INTRAVENOUS
  Filled 2021-08-30: qty 10

## 2021-08-30 MED ORDER — SODIUM CHLORIDE 0.9 % IV SOLN: 200.0000 mg | Freq: Once | INTRAVENOUS | Status: DC

## 2021-08-30 NOTE — Patient Instructions (Signed)

## 2021-08-31 ENCOUNTER — Ambulatory Visit: Payer: BC Managed Care – PPO

## 2021-08-31 DIAGNOSIS — J069 Acute upper respiratory infection, unspecified: Secondary | ICD-10-CM | POA: Diagnosis not present

## 2021-09-06 DIAGNOSIS — E1169 Type 2 diabetes mellitus with other specified complication: Secondary | ICD-10-CM | POA: Diagnosis not present

## 2021-09-06 DIAGNOSIS — E782 Mixed hyperlipidemia: Secondary | ICD-10-CM | POA: Diagnosis not present

## 2021-09-06 DIAGNOSIS — E669 Obesity, unspecified: Secondary | ICD-10-CM | POA: Diagnosis not present

## 2021-09-06 LAB — LIPID PANEL
Cholesterol: 113 (ref 0–200)
HDL: 29 — AB (ref 35–70)
LDL Cholesterol: 66
Triglycerides: 85 (ref 40–160)

## 2021-09-06 LAB — MICROALBUMIN / CREATININE URINE RATIO: Microalb Creat Ratio: 8.9

## 2021-09-06 LAB — HEMOGLOBIN A1C: Hemoglobin A1C: 6.4

## 2021-09-07 ENCOUNTER — Inpatient Hospital Stay: Payer: BC Managed Care – PPO

## 2021-09-07 ENCOUNTER — Other Ambulatory Visit: Payer: Self-pay

## 2021-09-07 VITALS — BP 128/91 | HR 81 | Temp 97.9°F | Resp 16

## 2021-09-07 DIAGNOSIS — N920 Excessive and frequent menstruation with regular cycle: Secondary | ICD-10-CM | POA: Diagnosis not present

## 2021-09-07 DIAGNOSIS — E538 Deficiency of other specified B group vitamins: Secondary | ICD-10-CM | POA: Diagnosis not present

## 2021-09-07 DIAGNOSIS — R051 Acute cough: Secondary | ICD-10-CM | POA: Diagnosis not present

## 2021-09-07 DIAGNOSIS — D509 Iron deficiency anemia, unspecified: Secondary | ICD-10-CM

## 2021-09-07 DIAGNOSIS — D500 Iron deficiency anemia secondary to blood loss (chronic): Secondary | ICD-10-CM | POA: Diagnosis not present

## 2021-09-07 MED ORDER — IRON SUCROSE 20 MG/ML IV SOLN
200.0000 mg | Freq: Once | INTRAVENOUS | Status: AC
  Administered 2021-09-07: 200 mg via INTRAVENOUS
  Filled 2021-09-07: qty 10

## 2021-09-07 MED ORDER — SODIUM CHLORIDE 0.9 % IV SOLN: 200.0000 mg | Freq: Once | INTRAVENOUS | Status: DC

## 2021-09-07 MED ORDER — SODIUM CHLORIDE 0.9 % IV SOLN
INTRAVENOUS | Status: DC | PRN
  Filled 2021-09-07: qty 250

## 2021-09-07 NOTE — Progress Notes (Signed)
Patient states that she just finished her menstrual cycle and there is no way she could be pregnant. ?

## 2021-09-12 DIAGNOSIS — E1169 Type 2 diabetes mellitus with other specified complication: Secondary | ICD-10-CM | POA: Diagnosis not present

## 2021-09-12 DIAGNOSIS — E559 Vitamin D deficiency, unspecified: Secondary | ICD-10-CM | POA: Diagnosis not present

## 2021-09-12 DIAGNOSIS — E782 Mixed hyperlipidemia: Secondary | ICD-10-CM | POA: Diagnosis not present

## 2021-09-12 DIAGNOSIS — E669 Obesity, unspecified: Secondary | ICD-10-CM | POA: Diagnosis not present

## 2021-09-14 ENCOUNTER — Other Ambulatory Visit: Payer: Self-pay

## 2021-09-14 ENCOUNTER — Inpatient Hospital Stay: Payer: BC Managed Care – PPO

## 2021-09-14 VITALS — BP 119/79 | HR 82 | Temp 97.8°F | Resp 16

## 2021-09-14 DIAGNOSIS — N920 Excessive and frequent menstruation with regular cycle: Secondary | ICD-10-CM | POA: Diagnosis not present

## 2021-09-14 DIAGNOSIS — E538 Deficiency of other specified B group vitamins: Secondary | ICD-10-CM | POA: Diagnosis not present

## 2021-09-14 DIAGNOSIS — D500 Iron deficiency anemia secondary to blood loss (chronic): Secondary | ICD-10-CM | POA: Diagnosis not present

## 2021-09-14 DIAGNOSIS — D509 Iron deficiency anemia, unspecified: Secondary | ICD-10-CM

## 2021-09-14 MED ORDER — SODIUM CHLORIDE 0.9 % IV SOLN
Freq: Once | INTRAVENOUS | Status: AC
  Filled 2021-09-14: qty 250

## 2021-09-14 MED ORDER — SODIUM CHLORIDE 0.9 % IV SOLN: 200.0000 mg | Freq: Once | INTRAVENOUS | Status: DC

## 2021-09-14 MED ORDER — IRON SUCROSE 20 MG/ML IV SOLN
200.0000 mg | Freq: Once | INTRAVENOUS | Status: AC
  Administered 2021-09-14: 200 mg via INTRAVENOUS
  Filled 2021-09-14: qty 10

## 2021-09-14 NOTE — Progress Notes (Signed)
1403- Patient reports she is not pregnant and has not had to provide urine pregnancy lab prior to her Venofer infusions recently. NP, Beckey Rutter, notified and aware. Per NP secure chat message: "Please advise of risk of iv iron during first trimester of pregnancy. If she's ok with proceeding, ok with me. Please document that patient wished to proceed." Patient educated about risk of IV Iron during first trimester of pregnancy; patient verbalizes understanding, and wishes to proceed with IV Venofer infusion at this time without urine pregnancy lab today. NP, Beckey Rutter, notified. Per NP order: okay to proceed with Venofer infusion without urine pregnancy lab at this time. ?

## 2021-09-14 NOTE — Patient Instructions (Signed)
[facility]   ?Discharge Instructions: ?Thank you for choosing [facility] to provide your oncology and hematology care.  ?If you have a lab appointment with the Cancer Center, please go directly to the Cancer Center and check in at the registration area. ?  ?We strive to give you quality time with your provider. You may need to reschedule your appointment if you arrive late (15 or more minutes).  Arriving late affects you and other patients whose appointments are after yours.  Also, if you miss three or more appointments without notifying the office, you may be dismissed from the clinic at the provider?s discretion.    ?  ?For prescription refill requests, have your pharmacy contact our office and allow 72 hours for refills to be completed.   ? ?Today you received the following: Venofer.    ?  ?BELOW ARE SYMPTOMS THAT SHOULD BE REPORTED IMMEDIATELY: ?*FEVER GREATER THAN 100.4 F (38 ?C) OR HIGHER ?*CHILLS OR SWEATING ?*NAUSEA AND VOMITING THAT IS NOT CONTROLLED WITH YOUR NAUSEA MEDICATION ?*UNUSUAL SHORTNESS OF BREATH ?*UNUSUAL BRUISING OR BLEEDING ?*URINARY PROBLEMS (pain or burning when urinating, or frequent urination) ?*BOWEL PROBLEMS (unusual diarrhea, constipation, pain near the anus) ?TENDERNESS IN MOUTH AND THROAT WITH OR WITHOUT PRESENCE OF ULCERS (sore throat, sores in mouth, or a toothache) ?UNUSUAL RASH, SWELLING OR PAIN  ?UNUSUAL VAGINAL DISCHARGE OR ITCHING  ? ?Items with * indicate a potential emergency and should be followed up as soon as possible or go to the Emergency Department if any problems should occur. ? ?Should you have questions after your visit or need to cancel or reschedule your appointment, please contact [facility]  Dept: [phone]  and follow the prompts.  Office hours are 8:00 a.m. to 4:30 p.m. Monday - Friday. Please note that voicemails left after 4:00 p.m. may not be returned until the following  business day.  We are closed weekends and major holidays. You have access to a nurse at all times for urgent questions. Please call the main number to the clinic Dept: [phone] and follow the prompts. ? ?For any non-urgent questions, you may also contact your provider using MyChart. We now offer e-Visits for anyone 18 and older to request care online for non-urgent symptoms. For details visit [email or web address]. ?  ?Also download the MyChart app! Go to the app store, search "MyChart", open the app, select [facility], and log in with your MyChart username and password. ? ?Due to Covid, a mask is required upon entering the hospital/clinic. If you do not have a mask, one will be given to you upon arrival. For doctor visits, patients may have 1 support person aged 18 or older with them. For treatment visits, patients cannot have anyone with them due to current Covid guidelines and our immunocompromised population.  ?

## 2021-09-21 ENCOUNTER — Inpatient Hospital Stay: Payer: BC Managed Care – PPO

## 2021-09-21 VITALS — BP 115/82 | HR 87 | Temp 98.4°F | Resp 17

## 2021-09-21 DIAGNOSIS — D500 Iron deficiency anemia secondary to blood loss (chronic): Secondary | ICD-10-CM | POA: Diagnosis not present

## 2021-09-21 DIAGNOSIS — E538 Deficiency of other specified B group vitamins: Secondary | ICD-10-CM | POA: Diagnosis not present

## 2021-09-21 DIAGNOSIS — D509 Iron deficiency anemia, unspecified: Secondary | ICD-10-CM

## 2021-09-21 DIAGNOSIS — N920 Excessive and frequent menstruation with regular cycle: Secondary | ICD-10-CM | POA: Diagnosis not present

## 2021-09-21 MED ORDER — IRON SUCROSE 20 MG/ML IV SOLN
200.0000 mg | Freq: Once | INTRAVENOUS | Status: AC
  Administered 2021-09-21: 200 mg via INTRAVENOUS
  Filled 2021-09-21: qty 10

## 2021-09-21 MED ORDER — SODIUM CHLORIDE 0.9 % IV SOLN
Freq: Once | INTRAVENOUS | Status: AC
  Filled 2021-09-21: qty 250

## 2021-09-21 MED ORDER — SODIUM CHLORIDE 0.9 % IV SOLN: 200.0000 mg | Freq: Once | INTRAVENOUS | Status: DC

## 2021-09-21 NOTE — Patient Instructions (Signed)

## 2021-09-22 DIAGNOSIS — E119 Type 2 diabetes mellitus without complications: Secondary | ICD-10-CM | POA: Diagnosis not present

## 2021-10-09 LAB — HM DIABETES EYE EXAM

## 2021-10-22 DIAGNOSIS — E119 Type 2 diabetes mellitus without complications: Secondary | ICD-10-CM | POA: Diagnosis not present

## 2021-11-14 ENCOUNTER — Encounter: Payer: Self-pay | Admitting: Oncology

## 2021-11-16 ENCOUNTER — Encounter: Payer: Self-pay | Admitting: Oncology

## 2021-11-16 NOTE — Telephone Encounter (Signed)
Signing encounter, see note 07/27/20  

## 2021-11-22 DIAGNOSIS — E119 Type 2 diabetes mellitus without complications: Secondary | ICD-10-CM | POA: Diagnosis not present

## 2021-12-04 ENCOUNTER — Encounter: Payer: Self-pay | Admitting: Oncology

## 2021-12-04 ENCOUNTER — Other Ambulatory Visit: Payer: Self-pay

## 2021-12-04 ENCOUNTER — Inpatient Hospital Stay: Payer: BC Managed Care – PPO | Attending: Nurse Practitioner

## 2021-12-04 ENCOUNTER — Inpatient Hospital Stay (HOSPITAL_BASED_OUTPATIENT_CLINIC_OR_DEPARTMENT_OTHER): Payer: BC Managed Care – PPO | Admitting: Oncology

## 2021-12-04 VITALS — BP 123/89 | HR 86 | Temp 98.0°F | Resp 18 | Wt 267.9 lb

## 2021-12-04 DIAGNOSIS — E538 Deficiency of other specified B group vitamins: Secondary | ICD-10-CM

## 2021-12-04 DIAGNOSIS — Z9884 Bariatric surgery status: Secondary | ICD-10-CM | POA: Insufficient documentation

## 2021-12-04 DIAGNOSIS — D509 Iron deficiency anemia, unspecified: Secondary | ICD-10-CM

## 2021-12-04 DIAGNOSIS — N920 Excessive and frequent menstruation with regular cycle: Secondary | ICD-10-CM | POA: Diagnosis not present

## 2021-12-04 DIAGNOSIS — K9589 Other complications of other bariatric procedure: Secondary | ICD-10-CM

## 2021-12-04 DIAGNOSIS — D508 Other iron deficiency anemias: Secondary | ICD-10-CM

## 2021-12-04 LAB — IRON AND TIBC
Iron: 66 ug/dL (ref 28–170)
Saturation Ratios: 17 % (ref 10.4–31.8)
TIBC: 399 ug/dL (ref 250–450)
UIBC: 333 ug/dL

## 2021-12-04 LAB — COMPREHENSIVE METABOLIC PANEL
ALT: 13 U/L (ref 0–44)
AST: 14 U/L — ABNORMAL LOW (ref 15–41)
Albumin: 3.9 g/dL (ref 3.5–5.0)
Alkaline Phosphatase: 71 U/L (ref 38–126)
Anion gap: 8 (ref 5–15)
BUN: 15 mg/dL (ref 6–20)
CO2: 23 mmol/L (ref 22–32)
Calcium: 8.7 mg/dL — ABNORMAL LOW (ref 8.9–10.3)
Chloride: 100 mmol/L (ref 98–111)
Creatinine, Ser: 0.83 mg/dL (ref 0.44–1.00)
GFR, Estimated: >60 mL/min (ref >60)
Glucose, Bld: 123 mg/dL — ABNORMAL HIGH (ref 70–99)
Potassium: 4.2 mmol/L (ref 3.5–5.1)
Sodium: 131 mmol/L — ABNORMAL LOW (ref 135–145)
Total Bilirubin: 0.5 mg/dL (ref 0.3–1.2)
Total Protein: 7.7 g/dL (ref 6.5–8.1)

## 2021-12-04 LAB — CBC WITH DIFFERENTIAL/PLATELET
Abs Immature Granulocytes: 0.02 10*3/uL (ref 0.00–0.07)
Basophils Absolute: 0.1 10*3/uL (ref 0.0–0.1)
Basophils Relative: 1 %
Eosinophils Absolute: 0.4 10*3/uL (ref 0.0–0.5)
Eosinophils Relative: 6 %
HCT: 37.9 % (ref 36.0–46.0)
Hemoglobin: 12.2 g/dL (ref 12.0–15.0)
Immature Granulocytes: 0 %
Lymphocytes Relative: 22 %
Lymphs Abs: 1.5 10*3/uL (ref 0.7–4.0)
MCH: 25.3 pg — ABNORMAL LOW (ref 26.0–34.0)
MCHC: 32.2 g/dL (ref 30.0–36.0)
MCV: 78.6 fL — ABNORMAL LOW (ref 80.0–100.0)
Monocytes Absolute: 0.6 10*3/uL (ref 0.1–1.0)
Monocytes Relative: 9 %
Neutro Abs: 4.2 10*3/uL (ref 1.7–7.7)
Neutrophils Relative %: 62 %
Platelets: 302 10*3/uL (ref 150–400)
RBC: 4.82 MIL/uL (ref 3.87–5.11)
RDW: 15.9 % — ABNORMAL HIGH (ref 11.5–15.5)
WBC: 6.7 10*3/uL (ref 4.0–10.5)
nRBC: 0 % (ref 0.0–0.2)

## 2021-12-04 LAB — VITAMIN B12: Vitamin B-12: 914 pg/mL (ref 180–914)

## 2021-12-04 LAB — FERRITIN: Ferritin: 57 ng/mL (ref 11–307)

## 2021-12-04 NOTE — Assessment & Plan Note (Addendum)
Labs reviewed and discussed with patient Hemoglobin is normal at 12. Iron saturation 17, ferritin 57, not consistent with iron deficiency anemia. No need for IV Venofer treatments at this point. Continue maintenance oral iron supplementation daily.

## 2021-12-04 NOTE — Assessment & Plan Note (Signed)
Check von Willebrand panel

## 2021-12-04 NOTE — Assessment & Plan Note (Addendum)
At risk of developing vitamin deficiency.  Check vitamin B12, folate, iron TIBC ferritin, periodically

## 2021-12-04 NOTE — Assessment & Plan Note (Signed)
B12 level is pending.

## 2021-12-04 NOTE — Progress Notes (Signed)
Hematology/Oncology Progress note [phone] [phone]     Jeanette LimerickJones, Jeanette C, MD  ASSESSMENT & PLAN:   Iron deficiency anemia Labs reviewed and discussed with patient Hemoglobin is normal at 12. Iron saturation 17, ferritin 57, not consistent with iron deficiency anemia. No need for IV Venofer treatments at this point. Continue maintenance oral iron supplementation daily.  B12 deficiency B12 level is pending.  History of gastric bypass At risk of developing vitamin deficiency.  Check vitamin B12, folate, iron TIBC ferritin, periodically  Menorrhagia Check von Willebrand panel   Orders Placed This Encounter  Procedures   Ferritin    Standing Status:   Future    Standing Expiration Date:   06/05/2022   Folate    Standing Status:   Future    Standing Expiration Date:   12/05/2022   Vitamin B12    Standing Status:   Future    Standing Expiration Date:   12/05/2022   CBC with Differential/Platelet    Standing Status:   Future    Standing Expiration Date:   12/05/2022   Iron and TIBC    Standing Status:   Future    Standing Expiration Date:   12/05/2022   Retic Panel    Standing Status:   Future    Standing Expiration Date:   12/05/2022   Follow-up in 6 months, labs prior to MD +/- Venofer. All questions were answered. The patient knows to call the clinic with any problems, questions or concerns. No barriers to learning was detected.  Jeanette PatienceZhou Mccall Will, MD, PhD [facility] 12/04/2021    INTERVAL HISTORY: Jeanette Flores 46 y.o. female presents for follow up of iron deficiency anemia, history of gastric bypass Patient previously followed up by Dr.Corcoran, patient switched care to me on 12/04/21 Extensive medical record review was performed by me  SUMMARY OF HEMATOLOGIC HISTORY: Patient has a history of gastric bypass surgery in 2008, chronic iron deficiency anemia, previously treated with multiple doses of IV Feraheme.  Recently due  to change of insurance coverage, switched to IV Venofer treatments and the tolerated well  Patient continues to have menorrhagia.  Reports feeling tired and fatigued. She takes oral iron supplementation daily.  Denies any black or bloody stool. I have reviewed the past medical history, past surgical history, social history and family history with the patient and they are unchanged from previous note.  ALLERGIES:  is allergic to penicillins and azithromycin.  MEDICATIONS:  Current Outpatient Medications  Medication Sig Dispense Refill   aspirin 81 MG tablet Take 1 tablet (81 mg total) by mouth daily. 30 tablet 11   ergocalciferol (VITAMIN D2) 1.25 MG (50000 UT) capsule TAKE 1 CAPSULE BY MOUTH 1 TIME A WEEK     ferrous sulfate 325 (65 FE) MG tablet Take 325 mg by mouth daily with breakfast. otc     fluticasone (FLONASE) 50 MCG/ACT nasal spray SHAKE LIQUID AND USE 2 SPRAYS IN EACH NOSTRIL DAILY 16 g 5   losartan-hydrochlorothiazide (HYZAAR) 100-12.5 MG tablet Take 1 tablet by mouth daily. 90 tablet 1   metFORMIN (GLUCOPHAGE XR) 500 MG 24 hr tablet Take 1 tablet (500 mg total) by mouth daily with breakfast. (Patient taking differently: Take 1,500 mg by mouth at bedtime. Dr Tedd SiasSolum) 90 tablet 1   tirzepatide [facility](MOUNJARO) 5 MG/0.5ML Pen Inject into the skin. endo     atorvastatin (LIPITOR) 20 MG tablet Take 1 tablet by mouth daily. Solum     No current facility-administered medications for  this visit.     Review of Systems  Constitutional:  Positive for fatigue. Negative for appetite change, chills and fever.  HENT:   Negative for hearing loss and voice change.   Eyes:  Negative for eye problems.  Respiratory:  Negative for chest tightness and cough.   Cardiovascular:  Negative for chest pain.  Gastrointestinal:  Negative for abdominal distention, abdominal pain and blood in stool.  Endocrine: Negative for hot flashes.  Genitourinary:  Positive for menstrual problem. Negative for difficulty  urinating and frequency.   Musculoskeletal:  Negative for arthralgias.  Skin:  Negative for itching and rash.  Neurological:  Negative for extremity weakness.  Hematological:  Negative for adenopathy.  Psychiatric/Behavioral:  Negative for confusion.      PHYSICAL EXAMINATION: ECOG PERFORMANCE STATUS: 1 - Symptomatic but completely ambulatory  Vitals:   12/04/21 1354  BP: 123/89  Pulse: 86  Resp: 18  Temp: 98 F (36.7 Flores)   Filed Weights   12/04/21 1354  Weight: 267 lb 14.4 oz (121.5 kg)    Physical Exam Constitutional:      General: She is not in acute distress.    Appearance: She is obese. She is not diaphoretic.  HENT:     Head: Normocephalic and atraumatic.     Nose: Nose normal.     Mouth/Throat:     Pharynx: No oropharyngeal exudate.  Eyes:     General: No scleral icterus.    Pupils: Pupils are equal, round, and reactive to light.  Cardiovascular:     Rate and Rhythm: Normal rate and regular rhythm.     Heart sounds: No murmur heard. Pulmonary:     Effort: Pulmonary effort is normal. No respiratory distress.     Breath sounds: No rales.  Chest:     Chest wall: No tenderness.  Abdominal:     General: There is no distension.     Palpations: Abdomen is soft.     Tenderness: There is no abdominal tenderness.  Musculoskeletal:        General: Normal range of motion.     Cervical back: Normal range of motion and neck supple.  Skin:    General: Skin is warm and dry.     Findings: No erythema.  Neurological:     Mental Status: She is alert and oriented to person, place, and time.     Cranial Nerves: No cranial nerve deficit.     Motor: No abnormal muscle tone.     Coordination: Coordination normal.  Psychiatric:        Mood and Affect: Affect normal.      LABORATORY DATA:  I have reviewed the data as listed    Latest Ref Rng & Units 12/04/2021    1:37 PM 08/27/2021    9:52 AM 12/21/2020    1:58 PM  CBC  WBC 4.0 - 10.5 K/uL 6.7  9.4  7.1   Hemoglobin  12.0 - 15.0 g/dL 16.1  09.6  04.5   Hematocrit 36.0 - 46.0 % 37.9  36.0  38.2   Platelets 150 - 400 K/uL 302  328  343       Latest Ref Rng & Units 12/04/2021    1:37 PM 03/06/2021   12:00 AM 02/19/2021   10:49 AM  CMP  Glucose 70 - 99 mg/dL 409   96   BUN 6 - 20 mg/dL 15  15     10    Creatinine 0.44 - 1.00 mg/dL  0.8  0.83   Sodium 135 - 145 mmol/L 131  138     137   Potassium 3.5 - 5.1 mmol/L 4.2  4.6     4.6   Chloride 98 - 111 mmol/L 100  103     101   CO2 22 - 32 mmol/L 23   24   Calcium 8.9 - 10.3 mg/dL 8.7  9.3     9.0   Total Protein 6.5 - 8.1 g/dL 7.7     Total Bilirubin 0.3 - 1.2 mg/dL 0.5     Alkaline Phos 38 - 126 U/L 71     AST 15 - 41 U/L 14     ALT 0 - 44 U/L 13        This result is from an external source.   Iron/TIBC/Ferritin/ %Sat    Component Value Date/Time   IRON 66 12/04/2021 1337   IRON 129 08/23/2013 0827   TIBC 399 12/04/2021 1337   TIBC 432 08/23/2013 0827   FERRITIN 57 12/04/2021 1337   FERRITIN 25 08/23/2013 0827   IRONPCTSAT 17 12/04/2021 1337   IRONPCTSAT 30 08/23/2013 0827       RADIOGRAPHIC STUDIES: I have personally reviewed the radiological images as listed and agreed with the findings in the report. No results found.

## 2021-12-04 NOTE — Progress Notes (Signed)
Patient here for follow up. Reports she feels tired even after getting iron infusion.

## 2021-12-05 ENCOUNTER — Telehealth: Payer: Self-pay | Admitting: Oncology

## 2021-12-05 NOTE — Telephone Encounter (Signed)
Pt informed of MD recommendations and verbalized understanding.

## 2021-12-05 NOTE — Telephone Encounter (Signed)
error 

## 2021-12-05 NOTE — Telephone Encounter (Signed)
-----   Message from Rickard Patience, MD sent at 12/04/2021  8:24 PM EDT ----- Patient know that her iron is normal.  No need to do IV iron treatments. Vitamin B12 level is pending Recommend patient to continue oral iron supplementation. Follow-up plan 6 months, Lab prior to MD +/- Venofer.  Labs are ordered.

## 2021-12-05 NOTE — Telephone Encounter (Signed)
Spoke with pt to notify 6 mo appt details, pt confirmed.

## 2021-12-22 DIAGNOSIS — E119 Type 2 diabetes mellitus without complications: Secondary | ICD-10-CM | POA: Diagnosis not present

## 2022-01-22 DIAGNOSIS — E119 Type 2 diabetes mellitus without complications: Secondary | ICD-10-CM | POA: Diagnosis not present

## 2022-02-04 ENCOUNTER — Encounter: Payer: Self-pay | Admitting: Oncology

## 2022-02-21 ENCOUNTER — Other Ambulatory Visit: Payer: Self-pay | Admitting: Family Medicine

## 2022-02-21 DIAGNOSIS — E1159 Type 2 diabetes mellitus with other circulatory complications: Secondary | ICD-10-CM

## 2022-02-22 DIAGNOSIS — E119 Type 2 diabetes mellitus without complications: Secondary | ICD-10-CM | POA: Diagnosis not present

## 2022-02-26 ENCOUNTER — Encounter: Payer: Self-pay | Admitting: Family Medicine

## 2022-02-26 ENCOUNTER — Ambulatory Visit (INDEPENDENT_AMBULATORY_CARE_PROVIDER_SITE_OTHER): Payer: BC Managed Care – PPO | Admitting: Family Medicine

## 2022-02-26 VITALS — BP 120/80 | HR 68 | Ht 65.0 in | Wt 253.0 lb

## 2022-02-26 DIAGNOSIS — I152 Hypertension secondary to endocrine disorders: Secondary | ICD-10-CM

## 2022-02-26 DIAGNOSIS — J301 Allergic rhinitis due to pollen: Secondary | ICD-10-CM | POA: Diagnosis not present

## 2022-02-26 DIAGNOSIS — E785 Hyperlipidemia, unspecified: Secondary | ICD-10-CM | POA: Diagnosis not present

## 2022-02-26 DIAGNOSIS — Z23 Encounter for immunization: Secondary | ICD-10-CM

## 2022-02-26 DIAGNOSIS — E1159 Type 2 diabetes mellitus with other circulatory complications: Secondary | ICD-10-CM

## 2022-02-26 MED ORDER — LOSARTAN POTASSIUM-HCTZ 100-12.5 MG PO TABS: 1.0000 | ORAL_TABLET | Freq: Every day | ORAL | 0 refills | Status: DC

## 2022-02-26 MED ORDER — ATORVASTATIN CALCIUM 20 MG PO TABS: 20.0000 mg | ORAL_TABLET | Freq: Every day | ORAL | 1 refills | Status: DC

## 2022-02-26 MED ORDER — FLUTICASONE PROPIONATE 50 MCG/ACT NA SUSP: NASAL | 5 refills | Status: DC

## 2022-02-26 NOTE — Progress Notes (Signed)
Date:  02/26/2022   Name:  Jeanette Flores   DOB:  1976-04-16   MRN:  [id number]   Chief Complaint: Hypertension, Allergic Rhinitis , Hyperlipidemia, and Flu Vaccine  Hypertension This is a chronic problem. The current episode started more than 1 year ago. The problem has been gradually improving since onset. The problem is controlled. Pertinent negatives include no anxiety, blurred vision, chest pain, headaches, malaise/fatigue, neck pain, orthopnea, palpitations, peripheral edema, PND, shortness of breath or sweats. There are no associated agents to hypertension. Risk factors for coronary artery disease include dyslipidemia. Past treatments include angiotensin blockers and diuretics. The current treatment provides moderate improvement. There are no compliance problems.  There is no history of angina, kidney disease, CAD/MI, CVA, heart failure, left ventricular hypertrophy, PVD or retinopathy. There is no history of chronic renal disease, a hypertension causing med or renovascular disease. hx tia.  Hyperlipidemia This is a chronic problem. The current episode started more than 1 year ago. The problem is controlled. Recent lipid tests were reviewed and are normal. Exacerbating diseases include obesity. She has no history of chronic renal disease. Pertinent negatives include no chest pain, myalgias or shortness of breath. Current antihyperlipidemic treatment includes statins. The current treatment provides moderate improvement of lipids. There are no compliance problems.  Risk factors for coronary artery disease include dyslipidemia and hypertension.  URI  Chronicity: for allegic rhinitis. The current episode started in the past 7 days. The problem has been gradually improving. There has been no fever. Pertinent negatives include no abdominal pain, chest pain, congestion, coughing, diarrhea, dysuria, ear pain, headaches, nausea, neck pain, rash, rhinorrhea, sneezing, sore throat or wheezing. The  treatment provided moderate relief.    Lab Results  Component Value Date   NA 131 (L) 12/04/2021   K 4.2 12/04/2021   CO2 23 12/04/2021   GLUCOSE 123 (H) 12/04/2021   BUN 15 12/04/2021   CREATININE 0.83 12/04/2021   CALCIUM 8.7 (L) 12/04/2021   EGFR 90 02/19/2021   GFRNONAA >60 12/04/2021   Lab Results  Component Value Date   CHOL 113 09/06/2021   HDL 29 (A) 09/06/2021   LDLCALC 66 09/06/2021   TRIG 85 09/06/2021   CHOLHDL 5.7 (H) 07/02/2017   No results found for: "TSH" Lab Results  Component Value Date   HGBA1C 6.4 09/06/2021   Lab Results  Component Value Date   WBC 6.7 12/04/2021   HGB 12.2 12/04/2021   HCT 37.9 12/04/2021   MCV 78.6 (L) 12/04/2021   PLT 302 12/04/2021   Lab Results  Component Value Date   ALT 13 12/04/2021   AST 14 (L) 12/04/2021   ALKPHOS 71 12/04/2021   BILITOT 0.5 12/04/2021   No results found for: "25OHVITD2", "25OHVITD3", "VD25OH"   Review of Systems  Constitutional: Negative.  Negative for chills, fatigue, fever, malaise/fatigue and unexpected weight change.  HENT:  Negative for congestion, ear discharge, ear pain, rhinorrhea, sinus pressure, sneezing and sore throat.   Eyes:  Negative for blurred vision.  Respiratory:  Negative for cough, shortness of breath, wheezing and stridor.   Cardiovascular:  Negative for chest pain, palpitations, orthopnea, leg swelling and PND.  Gastrointestinal:  Negative for abdominal pain, blood in stool, constipation, diarrhea and nausea.  Genitourinary:  Negative for dysuria, flank pain, frequency, hematuria, urgency and vaginal discharge.  Musculoskeletal:  Negative for arthralgias, back pain, myalgias and neck pain.  Skin:  Negative for rash.  Neurological:  Negative for dizziness, weakness and headaches.  Hematological:  Negative for adenopathy. Does not bruise/bleed easily.  Psychiatric/Behavioral:  Negative for dysphoric mood. The patient is not nervous/anxious.     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   Menorrhagia 01/10/2019   B12 deficiency 01/10/2019   Morbid obesity ([place]) 07/02/2017   Hypertension associated with diabetes ([place]) 07/02/2017   Dyslipidemia 07/02/2017   Iron deficiency anemia 11/08/2015   Essential hypertension 11/02/2015   Type 2 diabetes mellitus without complication, without long-term current use of insulin ([place]) 11/02/2015   Hyperlipidemia 11/02/2015   Overweight 11/02/2015    Allergies  Allergen Reactions   Penicillins Hives   Azithromycin Hives    Past Surgical History:  Procedure Laterality Date   Gastric bypass surgery      Social History   Tobacco Use   Smoking status: Never   Smokeless tobacco: Never  Substance Use Topics   Alcohol use: No    Alcohol/week: 0.0 standard drinks of alcohol   Drug use: No     Medication list has been reviewed and updated.  Current Meds  Medication Sig   aspirin 81 MG tablet Take 1 tablet (81 mg total) by mouth daily.   atorvastatin (LIPITOR) 20 MG tablet Take 1 tablet by mouth daily. Solum   ergocalciferol (VITAMIN D2) 1.25 MG (50000 UT) capsule TAKE 1 CAPSULE BY MOUTH 1 TIME A WEEK   ferrous sulfate 325 (65 FE) MG tablet Take 325 mg by mouth daily with breakfast. otc   fluticasone (FLONASE) 50 MCG/ACT nasal spray SHAKE LIQUID AND USE 2 SPRAYS IN EACH NOSTRIL DAILY   losartan-hydrochlorothiazide (HYZAAR) 100-12.5 MG tablet TAKE 1 TABLET BY MOUTH DAILY   metFORMIN (GLUCOPHAGE XR) 500 MG 24 hr tablet Take 1 tablet (500 mg total) by mouth daily with breakfast. (Patient taking differently: Take 1,500 mg by mouth at bedtime. Dr Gabriel Carina)   tirzepatide [facility]) 5 MG/0.5ML Pen Inject into the skin. endo       02/26/2022    8:26 AM 02/19/2021   10:07 AM 01/24/2021   11:15 AM 06/07/2020    8:44 AM  GAD 7 : Generalized Anxiety Score  Nervous, Anxious, on Edge 1 1 0 1  Control/stop worrying 0 0 0 0  Worry too much -  different things 1 0 0 0  Trouble relaxing 0 0 0 1  Restless 0 0 0 0  Easily annoyed or irritable 0 0 0 1  Afraid - awful might happen 0 0 0 0  Total GAD 7 Score 2 1 0 3  Anxiety Difficulty Not difficult at all Not difficult at all  Somewhat difficult       02/26/2022    8:26 AM 02/19/2021   10:07 AM 01/24/2021   11:15 AM  Depression screen PHQ 2/9  Decreased Interest 0 0 0  Down, Depressed, Hopeless 0 0 0  PHQ - 2 Score 0 0 0  Altered sleeping 0 0 0  Tired, decreased energy 1 0 0  Change in appetite 0 0 0  Feeling bad or failure about yourself  0 0 0  Trouble concentrating 0 0 0  Moving slowly or fidgety/restless 0 0 0  Suicidal thoughts 0 0 0  PHQ-9 Score 1 0 0  Difficult doing work/chores Not difficult at all      BP Readings from Last 3 Encounters:  02/26/22 120/80  12/04/21 123/89  09/21/21 115/82    Physical Exam Vitals and nursing note  reviewed.  Constitutional:      Appearance: She is well-developed.  HENT:     Head: Normocephalic.     Right Ear: Tympanic membrane and external ear normal.     Left Ear: Tympanic membrane and external ear normal.     Nose: Nose normal.     Mouth/Throat:     Mouth: Mucous membranes are moist.  Eyes:     General: Lids are everted, no foreign bodies appreciated. No scleral icterus.       Left eye: No foreign body or hordeolum.     Conjunctiva/sclera: Conjunctivae normal.     Right eye: Right conjunctiva is not injected.     Left eye: Left conjunctiva is not injected.     Pupils: Pupils are equal, round, and reactive to light.  Neck:     Thyroid: No thyromegaly.     Vascular: No JVD.     Trachea: No tracheal deviation.  Cardiovascular:     Rate and Rhythm: Normal rate and regular rhythm.     Heart sounds: Normal heart sounds, S1 normal and S2 normal. No murmur heard.    No systolic murmur is present.     No diastolic murmur is present.     No friction rub. No gallop. No S3 or S4 sounds.  Pulmonary:     Effort: Pulmonary  effort is normal. No respiratory distress.     Breath sounds: Normal breath sounds. No wheezing, rhonchi or rales.  Abdominal:     General: Bowel sounds are normal.     Palpations: Abdomen is soft. There is no mass.     Tenderness: There is no abdominal tenderness. There is no guarding or rebound.  Musculoskeletal:        General: No tenderness. Normal range of motion.     Cervical back: Normal range of motion and neck supple.  Lymphadenopathy:     Cervical: No cervical adenopathy.  Skin:    General: Skin is warm.     Findings: No rash.  Neurological:     Mental Status: She is alert and oriented to person, place, and time.     Cranial Nerves: No cranial nerve deficit.     Deep Tendon Reflexes: Reflexes normal.  Psychiatric:        Mood and Affect: Mood is not anxious or depressed.     Wt Readings from Last 3 Encounters:  02/26/22 253 lb (114.8 kg)  12/04/21 267 lb 14.4 oz (121.5 kg)  08/27/21 272 lb 9.6 oz (123.7 kg)    BP 120/80   Pulse 68   Ht '5\' 5"'  (1.651 m)   Wt 253 lb (114.8 kg)   LMP 02/23/2022 (Exact Date)   BMI 42.10 kg/m   Assessment and Plan:  1. Hypertension associated with diabetes ([place]) Chronic.  Controlled.  Stable.  Blood pressure 120/80.  Asymptomatic.  Tolerating medication well continue losartan hydrochlorothiazide 100-12.5 mg daily we will check renal function GFR and electrolytes - losartan-hydrochlorothiazide (HYZAAR) 100-12.5 MG tablet; Take 1 tablet by mouth daily.  Dispense: 30 tablet; Refill: 0 - Renal Function Panel  2. Seasonal allergic rhinitis due to pollen Chronic.  Controlled.  Stable.  Continue fluticasone nasal spray 2 sprays each nostril daily. - fluticasone (FLONASE) 50 MCG/ACT nasal spray; SHAKE LIQUID AND USE 2 SPRAYS IN EACH NOSTRIL DAILY  Dispense: 16 g; Refill: 5  3. Dyslipidemia Chronic.  Controlled.  Stable.  Excellent LDL HDL is a little decreased on last lipid panel.  We will recheck  lipid panel at this time. - Renal  Function Panel  4. Morbid obesity ([place]) Weight loss discussed.Marland KitchenHealth risks of being over weight were discussed and patient was counseled on weight loss options and exercise.  Low-cholesterol triglyceride guidelines given. - atorvastatin (LIPITOR) 20 MG tablet; Take 1 tablet (20 mg total) by mouth daily. Solum  Dispense: 90 tablet; Refill: 1  5. Need for immunization against influenza Discussed and administered. - Flu Vaccine QUAD 30moIM (Fluarix, Fluzone & Alfiuria Quad PF)    DOtilio Miu MD

## 2022-02-26 NOTE — Patient Instructions (Signed)

## 2022-02-27 LAB — RENAL FUNCTION PANEL
Albumin: 4 g/dL (ref 3.9–4.9)
BUN/Creatinine Ratio: 16 (ref 9–23)
BUN: 15 mg/dL (ref 6–24)
CO2: 20 mmol/L (ref 20–29)
Calcium: 9.4 mg/dL (ref 8.7–10.2)
Chloride: 101 mmol/L (ref 96–106)
Creatinine, Ser: 0.93 mg/dL (ref 0.57–1.00)
Glucose: 84 mg/dL (ref 70–99)
Phosphorus: 4 mg/dL (ref 3.0–4.3)
Potassium: 4.6 mmol/L (ref 3.5–5.2)
Sodium: 137 mmol/L (ref 134–144)
eGFR: 78 mL/min/{1.73_m2} (ref >59)

## 2022-03-14 DIAGNOSIS — E669 Obesity, unspecified: Secondary | ICD-10-CM | POA: Diagnosis not present

## 2022-03-14 DIAGNOSIS — E1169 Type 2 diabetes mellitus with other specified complication: Secondary | ICD-10-CM | POA: Diagnosis not present

## 2022-03-14 DIAGNOSIS — E559 Vitamin D deficiency, unspecified: Secondary | ICD-10-CM | POA: Diagnosis not present

## 2022-03-14 LAB — MICROALBUMIN, URINE: Microalb, Ur: 223

## 2022-03-14 LAB — MICROALBUMIN / CREATININE URINE RATIO: Microalb Creat Ratio: 7.2

## 2022-03-14 LAB — HEMOGLOBIN A1C: Hemoglobin A1C: 6.1

## 2022-03-15 DIAGNOSIS — E1169 Type 2 diabetes mellitus with other specified complication: Secondary | ICD-10-CM | POA: Diagnosis not present

## 2022-03-15 DIAGNOSIS — E669 Obesity, unspecified: Secondary | ICD-10-CM | POA: Diagnosis not present

## 2022-03-21 DIAGNOSIS — E1169 Type 2 diabetes mellitus with other specified complication: Secondary | ICD-10-CM | POA: Diagnosis not present

## 2022-03-21 DIAGNOSIS — E669 Obesity, unspecified: Secondary | ICD-10-CM | POA: Diagnosis not present

## 2022-03-21 DIAGNOSIS — E782 Mixed hyperlipidemia: Secondary | ICD-10-CM | POA: Diagnosis not present

## 2022-03-21 DIAGNOSIS — E559 Vitamin D deficiency, unspecified: Secondary | ICD-10-CM | POA: Diagnosis not present

## 2022-03-24 DIAGNOSIS — E119 Type 2 diabetes mellitus without complications: Secondary | ICD-10-CM | POA: Diagnosis not present

## 2022-03-27 ENCOUNTER — Encounter: Payer: Self-pay | Admitting: Family Medicine

## 2022-03-28 ENCOUNTER — Encounter: Payer: Self-pay | Admitting: Family Medicine

## 2022-03-28 DIAGNOSIS — J019 Acute sinusitis, unspecified: Secondary | ICD-10-CM | POA: Diagnosis not present

## 2022-04-01 ENCOUNTER — Other Ambulatory Visit: Payer: Self-pay

## 2022-04-01 DIAGNOSIS — E1159 Type 2 diabetes mellitus with other circulatory complications: Secondary | ICD-10-CM

## 2022-04-01 MED ORDER — LOSARTAN POTASSIUM-HCTZ 50-12.5 MG PO TABS: 1.0000 | ORAL_TABLET | Freq: Every day | ORAL | 0 refills | Status: DC

## 2022-04-22 ENCOUNTER — Encounter: Payer: Self-pay | Admitting: Family Medicine

## 2022-04-22 ENCOUNTER — Ambulatory Visit (INDEPENDENT_AMBULATORY_CARE_PROVIDER_SITE_OTHER): Payer: BC Managed Care – PPO | Admitting: Family Medicine

## 2022-04-22 VITALS — BP 128/70 | HR 84 | Ht 63.0 in | Wt 242.0 lb

## 2022-04-22 DIAGNOSIS — E119 Type 2 diabetes mellitus without complications: Secondary | ICD-10-CM

## 2022-04-22 DIAGNOSIS — Z6841 Body Mass Index (BMI) 40.0 and over, adult: Secondary | ICD-10-CM | POA: Diagnosis not present

## 2022-04-22 DIAGNOSIS — E785 Hyperlipidemia, unspecified: Secondary | ICD-10-CM

## 2022-04-22 DIAGNOSIS — E1159 Type 2 diabetes mellitus with other circulatory complications: Secondary | ICD-10-CM

## 2022-04-22 DIAGNOSIS — J301 Allergic rhinitis due to pollen: Secondary | ICD-10-CM

## 2022-04-22 DIAGNOSIS — L2082 Flexural eczema: Secondary | ICD-10-CM

## 2022-04-22 DIAGNOSIS — I152 Hypertension secondary to endocrine disorders: Secondary | ICD-10-CM

## 2022-04-22 MED ORDER — LOSARTAN POTASSIUM-HCTZ 50-12.5 MG PO TABS: 1.0000 | ORAL_TABLET | Freq: Every day | ORAL | 1 refills | Status: DC

## 2022-04-22 MED ORDER — FLUTICASONE PROPIONATE 50 MCG/ACT NA SUSP: NASAL | 5 refills | Status: DC

## 2022-04-22 MED ORDER — TRIAMCINOLONE ACETONIDE 0.1 % EX CREA: 1.0000 | TOPICAL_CREAM | Freq: Two times a day (BID) | CUTANEOUS | 0 refills | Status: DC

## 2022-04-22 MED ORDER — LOSARTAN POTASSIUM-HCTZ 50-12.5 MG PO TABS: 1.0000 | ORAL_TABLET | Freq: Every day | ORAL | 0 refills | Status: DC

## 2022-04-22 MED ORDER — ATORVASTATIN CALCIUM 20 MG PO TABS: 20.0000 mg | ORAL_TABLET | Freq: Every day | ORAL | 1 refills | Status: DC

## 2022-04-22 NOTE — Progress Notes (Signed)
Date:  04/22/2022   Name:  Jeanette Flores   DOB:  1975/11/16   MRN:  [id number]   Chief Complaint: Hypertension (Follow up- foot exam)  Hypertension This is a chronic problem. The current episode started more than 1 year ago. The problem has been gradually improving since onset. The problem is controlled. Pertinent negatives include no blurred vision, chest pain, headaches, neck pain, orthopnea, palpitations, peripheral edema, PND or shortness of breath. Risk factors for coronary artery disease include diabetes mellitus, dyslipidemia and obesity. Past treatments include angiotensin blockers and diuretics. There are no compliance problems.  There is no history of angina, CAD/MI or CVA. tia 2010.  Diabetes Pertinent negatives for hypoglycemia include no dizziness, headaches or nervousness/anxiousness. Pertinent negatives for diabetes include no blurred vision, no chest pain and no polydipsia. Pertinent negatives for diabetic complications include no CVA.  Hyperlipidemia This is a chronic (low HDL) problem. The current episode started more than 1 year ago. The problem is controlled. Exacerbating diseases include diabetes and obesity. Pertinent negatives include no chest pain, myalgias or shortness of breath. Current antihyperlipidemic treatment includes diet change.  Rash This is a chronic problem. The current episode started more than 1 year ago. The problem has been waxing and waning since onset. The affected locations include the neck. Pertinent negatives include no cough, diarrhea, fever, shortness of breath or sore throat.    Lab Results  Component Value Date   NA 137 02/26/2022   K 4.6 02/26/2022   CO2 20 02/26/2022   GLUCOSE 84 02/26/2022   BUN 15 02/26/2022   CREATININE 0.93 02/26/2022   CALCIUM 9.4 02/26/2022   EGFR 78 02/26/2022   GFRNONAA >60 12/04/2021   Lab Results  Component Value Date   CHOL 113 09/06/2021   HDL 29 (A) 09/06/2021   LDLCALC 66 09/06/2021   TRIG 85  09/06/2021   CHOLHDL 5.7 (H) 07/02/2017   No results found for: "TSH" Lab Results  Component Value Date   HGBA1C 6.1 03/14/2022   Lab Results  Component Value Date   WBC 6.7 12/04/2021   HGB 12.2 12/04/2021   HCT 37.9 12/04/2021   MCV 78.6 (L) 12/04/2021   PLT 302 12/04/2021   Lab Results  Component Value Date   ALT 13 12/04/2021   AST 14 (L) 12/04/2021   ALKPHOS 71 12/04/2021   BILITOT 0.5 12/04/2021   No results found for: "25OHVITD2", "25OHVITD3", "VD25OH"   Review of Systems  Constitutional:  Negative for chills and fever.  HENT:  Negative for drooling, ear discharge, ear pain and sore throat.   Eyes:  Negative for blurred vision.  Respiratory:  Negative for cough, shortness of breath and wheezing.   Cardiovascular:  Negative for chest pain, palpitations, orthopnea, leg swelling and PND.  Gastrointestinal:  Negative for abdominal pain, blood in stool, constipation, diarrhea and nausea.  Endocrine: Negative for polydipsia.  Genitourinary:  Negative for dysuria, frequency, hematuria and urgency.  Musculoskeletal:  Negative for back pain, myalgias and neck pain.  Skin:  Positive for rash.  Allergic/Immunologic: Negative for environmental allergies.  Neurological:  Negative for dizziness and headaches.  Hematological:  Does not bruise/bleed easily.  Psychiatric/Behavioral:  Negative for suicidal ideas. The patient is not nervous/anxious.     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   Menorrhagia 01/10/2019   B12 deficiency 01/10/2019   Morbid obesity ([place]) 07/02/2017   Hypertension associated with  diabetes ([place]) 07/02/2017   Dyslipidemia 07/02/2017   Iron deficiency anemia 11/08/2015   Essential hypertension 11/02/2015   Type 2 diabetes mellitus without complication, without long-term current use of insulin ([place]) 11/02/2015   Hyperlipidemia 11/02/2015   Overweight 11/02/2015     Allergies  Allergen Reactions   Penicillins Hives   Azithromycin Hives    Past Surgical History:  Procedure Laterality Date   Gastric bypass surgery      Social History   Tobacco Use   Smoking status: Never   Smokeless tobacco: Never  Substance Use Topics   Alcohol use: No    Alcohol/week: 0.0 standard drinks of alcohol   Drug use: No     Medication list has been reviewed and updated.  Current Meds  Medication Sig   aspirin 81 MG tablet Take 1 tablet (81 mg total) by mouth daily.   atorvastatin (LIPITOR) 20 MG tablet Take 1 tablet (20 mg total) by mouth daily. Solum   ergocalciferol (VITAMIN D2) 1.25 MG (50000 UT) capsule TAKE 1 CAPSULE BY MOUTH 1 TIME A WEEK   ferrous sulfate 325 (65 FE) MG tablet Take 325 mg by mouth daily with breakfast. otc   fluticasone (FLONASE) 50 MCG/ACT nasal spray SHAKE LIQUID AND USE 2 SPRAYS IN EACH NOSTRIL DAILY   losartan-hydrochlorothiazide (HYZAAR) 50-12.5 MG tablet Take 1 tablet by mouth daily.   metFORMIN (GLUCOPHAGE XR) 500 MG 24 hr tablet Take 1 tablet (500 mg total) by mouth daily with breakfast. (Patient taking differently: Take 1,500 mg by mouth at bedtime. Dr Gabriel Carina)   tirzepatide [facility]) 10 MG/0.5ML Pen Inject into the skin. endo       04/22/2022    9:09 AM 02/26/2022    8:26 AM 02/19/2021   10:07 AM 01/24/2021   11:15 AM  GAD 7 : Generalized Anxiety Score  Nervous, Anxious, on Edge 0 1 1 0  Control/stop worrying 0 0 0 0  Worry too much - different things 0 1 0 0  Trouble relaxing 0 0 0 0  Restless 0 0 0 0  Easily annoyed or irritable 0 0 0 0  Afraid - awful might happen 0 0 0 0  Total GAD 7 Score 0 2 1 0  Anxiety Difficulty Not difficult at all Not difficult at all Not difficult at all        04/22/2022    9:09 AM 02/26/2022    8:26 AM 02/19/2021   10:07 AM  Depression screen PHQ 2/9  Decreased Interest 0 0 0  Down, Depressed, Hopeless 0 0 0  PHQ - 2 Score 0 0 0  Altered sleeping 0 0 0  Tired, decreased energy  0 1 0  Change in appetite 0 0 0  Feeling bad or failure about yourself  0 0 0  Trouble concentrating 0 0 0  Moving slowly or fidgety/restless 0 0 0  Suicidal thoughts 0 0 0  PHQ-9 Score 0 1 0  Difficult doing work/chores Not difficult at all Not difficult at all     BP Readings from Last 3 Encounters:  04/22/22 128/70  02/26/22 120/80  12/04/21 123/89    Physical Exam Vitals and nursing note reviewed.  Constitutional:      Appearance: She is well-developed.  HENT:     Head: Normocephalic.     Right Ear: Tympanic membrane, ear canal and external ear normal.     Left Ear: Tympanic membrane, ear canal and external ear normal.     Nose: Nose normal.  Mouth/Throat:     Mouth: Mucous membranes are moist.  Eyes:     General: Lids are everted, no foreign bodies appreciated. No scleral icterus.       Left eye: No foreign body or hordeolum.     Conjunctiva/sclera:     Right eye: Right conjunctiva is not injected.     Left eye: Left conjunctiva is not injected.  Neck:     Thyroid: No thyromegaly.     Vascular: No JVD.     Trachea: No tracheal deviation.  Cardiovascular:     Rate and Rhythm: Normal rate and regular rhythm.     Pulses: Normal pulses.     Heart sounds: Normal heart sounds, S1 normal and S2 normal. No murmur heard.    No systolic murmur is present.     No diastolic murmur is present.     No friction rub. No gallop. No S3 or S4 sounds.  Pulmonary:     Effort: Pulmonary effort is normal. No respiratory distress.     Breath sounds: Normal breath sounds. No wheezing, rhonchi or rales.  Abdominal:     General: Bowel sounds are normal.     Palpations: Abdomen is soft. There is no hepatomegaly, splenomegaly or mass.     Tenderness: There is no abdominal tenderness. There is no guarding.  Musculoskeletal:        General: No tenderness. Normal range of motion.     Cervical back: Normal range of motion and neck supple.  Lymphadenopathy:     Cervical: No cervical  adenopathy.  Skin:    General: Skin is warm.     Findings: No rash.  Neurological:     Mental Status: She is alert and oriented to person, place, and time.     Cranial Nerves: No cranial nerve deficit.     Deep Tendon Reflexes: Reflexes normal.  Psychiatric:        Mood and Affect: Mood is not anxious or depressed.     Wt Readings from Last 3 Encounters:  04/22/22 242 lb (109.8 kg)  02/26/22 253 lb (114.8 kg)  12/04/21 267 lb 14.4 oz (121.5 kg)    BP 128/70   Pulse 84   Ht _0  (1.6 m)   Wt 242 lb (109.8 kg)   LMP 04/15/2022 (Approximate)   SpO2 99%   BMI 42.87 kg/m   Assessment and Plan: 1. Hypertension associated with diabetes ([place]) Chronic.  Controlled.  Stable.  Blood pressure 128/70.  Continue hydrochlorothiazide valsartan 50-12.5 mg. - losartan-hydrochlorothiazide (HYZAAR) 50-12.5 MG tablet; Take 1 tablet by mouth daily.  Dispense: 90 tablet; Refill: 1  2. Type 2 diabetes mellitus without complication, without long-term current use of insulin (HCC) Chronic.  Followed by endocrinology.  We will continue current regimen of Mounjaro.  3. BMI 40.0-44.9, adult [facility]) Health risks of being over weight were discussed and patient was counseled on weight loss options and exercise.   4. Hypertension associated with diabetes ([place]) Per above.  On losartan to counter nephrotoxicity of diabetes. - losartan-hydrochlorothiazide (HYZAAR) 50-12.5 MG tablet; Take 1 tablet by mouth daily.  Dispense: 30 tablet; Refill: 0  5. Morbid obesity ([place]) As noted above - atorvastatin (LIPITOR) 20 MG tablet; Take 1 tablet (20 mg total) by mouth daily. Solum  Dispense: 90 tablet; Refill: 1  6. Seasonal allergic rhinitis due to pollen Chronic.  Controlled.  Stable.  Continue Flonase 2 sprays each nostril daily. - fluticasone (FLONASE) 50 MCG/ACT nasal spray; SHAKE LIQUID AND USE  2 SPRAYS IN EACH NOSTRIL DAILY  Dispense: 16 g; Refill: 5  7. Flexural eczema Patient has history of flexural  eczema.  It is also in the area of the neck that she is using over-the-counter corticosteroid we will of the potency to triamcinolone 0.1% apply twice a day as needed. - triamcinolone cream (KENALOG) 0.1 %; Apply 1 Application topically 2 (two) times daily.  Dispense: 453.6 g; Refill: 0  8. Dyslipidemia Chronic.  Controlled.  Stable.  Currently on atorvastatin 20 mg once a day.  Currently this is under well control on the last LDL of 66.  But HDL remains low and will recheck current level of lipid panel.    Otilio Miu, MD

## 2022-04-24 DIAGNOSIS — E119 Type 2 diabetes mellitus without complications: Secondary | ICD-10-CM | POA: Diagnosis not present

## 2022-05-24 DIAGNOSIS — E119 Type 2 diabetes mellitus without complications: Secondary | ICD-10-CM | POA: Diagnosis not present

## 2022-06-03 DIAGNOSIS — J069 Acute upper respiratory infection, unspecified: Secondary | ICD-10-CM | POA: Diagnosis not present

## 2022-06-06 ENCOUNTER — Encounter: Payer: Self-pay | Admitting: Family Medicine

## 2022-06-06 ENCOUNTER — Ambulatory Visit (INDEPENDENT_AMBULATORY_CARE_PROVIDER_SITE_OTHER): Payer: BC Managed Care – PPO | Admitting: Family Medicine

## 2022-06-06 ENCOUNTER — Inpatient Hospital Stay: Payer: BC Managed Care – PPO | Attending: Oncology

## 2022-06-06 VITALS — BP 112/76 | HR 102 | Temp 98.1°F | Ht 63.0 in | Wt 233.0 lb

## 2022-06-06 DIAGNOSIS — J0100 Acute maxillary sinusitis, unspecified: Secondary | ICD-10-CM

## 2022-06-06 DIAGNOSIS — N920 Excessive and frequent menstruation with regular cycle: Secondary | ICD-10-CM | POA: Diagnosis not present

## 2022-06-06 DIAGNOSIS — K9589 Other complications of other bariatric procedure: Secondary | ICD-10-CM

## 2022-06-06 DIAGNOSIS — E860 Dehydration: Secondary | ICD-10-CM | POA: Diagnosis not present

## 2022-06-06 DIAGNOSIS — Z9884 Bariatric surgery status: Secondary | ICD-10-CM | POA: Insufficient documentation

## 2022-06-06 DIAGNOSIS — E538 Deficiency of other specified B group vitamins: Secondary | ICD-10-CM

## 2022-06-06 DIAGNOSIS — D509 Iron deficiency anemia, unspecified: Secondary | ICD-10-CM | POA: Insufficient documentation

## 2022-06-06 LAB — CBC WITH DIFFERENTIAL/PLATELET
Abs Immature Granulocytes: 0.01 10*3/uL (ref 0.00–0.07)
Basophils Absolute: 0 10*3/uL (ref 0.0–0.1)
Basophils Relative: 1 %
Eosinophils Absolute: 0.1 10*3/uL (ref 0.0–0.5)
Eosinophils Relative: 3 %
HCT: 38.3 % (ref 36.0–46.0)
Hemoglobin: 12.2 g/dL (ref 12.0–15.0)
Immature Granulocytes: 0 %
Lymphocytes Relative: 28 %
Lymphs Abs: 1 10*3/uL (ref 0.7–4.0)
MCH: 24.4 pg — ABNORMAL LOW (ref 26.0–34.0)
MCHC: 31.9 g/dL (ref 30.0–36.0)
MCV: 76.6 fL — ABNORMAL LOW (ref 80.0–100.0)
Monocytes Absolute: 0.4 10*3/uL (ref 0.1–1.0)
Monocytes Relative: 12 %
Neutro Abs: 2 10*3/uL (ref 1.7–7.7)
Neutrophils Relative %: 56 %
Platelets: 309 10*3/uL (ref 150–400)
RBC: 5 MIL/uL (ref 3.87–5.11)
RDW: 15.9 % — ABNORMAL HIGH (ref 11.5–15.5)
WBC: 3.6 10*3/uL — ABNORMAL LOW (ref 4.0–10.5)
nRBC: 0 % (ref 0.0–0.2)

## 2022-06-06 LAB — FOLATE: Folate: 10.3 ng/mL (ref >5.9)

## 2022-06-06 LAB — VITAMIN B12: Vitamin B-12: 659 pg/mL (ref 180–914)

## 2022-06-06 LAB — RETIC PANEL
Immature Retic Fract: 4.6 % (ref 2.3–15.9)
RBC.: 5.1 MIL/uL (ref 3.87–5.11)
Retic Count, Absolute: 34.2 10*3/uL (ref 19.0–186.0)
Retic Ct Pct: 0.7 % (ref 0.4–3.1)
Reticulocyte Hemoglobin: 26.1 pg — ABNORMAL LOW (ref >27.9)

## 2022-06-06 LAB — IRON AND TIBC
Iron: 63 ug/dL (ref 28–170)
Saturation Ratios: 18 % (ref 10.4–31.8)
TIBC: 354 ug/dL (ref 250–450)
UIBC: 291 ug/dL

## 2022-06-06 MED ORDER — DOXYCYCLINE HYCLATE 100 MG PO TABS: 100.0000 mg | ORAL_TABLET | Freq: Two times a day (BID) | ORAL | 0 refills | Status: DC

## 2022-06-06 NOTE — Progress Notes (Signed)
Date:  06/06/2022   Name:  Jeanette Flores   DOB:  [birth year]   MRN:  [id number]   Chief Complaint: Dizziness (Cough- production yellow, low grade fever Monday- covid negative)  Dizziness This is a new problem. The current episode started in the past 7 days. The problem occurs intermittently. The problem has been gradually worsening. Associated symptoms include congestion, coughing, fatigue, a fever and a sore throat. Pertinent negatives include no chest pain or chills.  Sinusitis This is a new problem. The current episode started in the past 7 days. Associated symptoms include congestion, coughing, ear pain and a sore throat. Pertinent negatives include no chills, shortness of breath or sinus pressure. Past treatments include nothing. The treatment provided mild relief.    Lab Results  Component Value Date   NA 137 02/26/2022   K 4.6 02/26/2022   CO2 20 02/26/2022   GLUCOSE 84 02/26/2022   BUN 15 02/26/2022   CREATININE 0.93 02/26/2022   CALCIUM 9.4 02/26/2022   EGFR 78 02/26/2022   GFRNONAA >60 12/04/2021   Lab Results  Component Value Date   CHOL 113 09/06/2021   HDL 29 (A) 09/06/2021   LDLCALC 66 09/06/2021   TRIG 85 09/06/2021   CHOLHDL 5.7 (H) 07/02/2017   No results found for: "TSH" Lab Results  Component Value Date   HGBA1C 6.1 03/14/2022   Lab Results  Component Value Date   WBC 3.6 (L) 06/06/2022   HGB 12.2 06/06/2022   HCT 38.3 06/06/2022   MCV 76.6 (L) 06/06/2022   PLT 309 06/06/2022   Lab Results  Component Value Date   ALT 13 12/04/2021   AST 14 (L) 12/04/2021   ALKPHOS 71 12/04/2021   BILITOT 0.5 12/04/2021   No results found for: "25OHVITD2", "25OHVITD3", "VD25OH"   Review of Systems  Constitutional:  Positive for fatigue and fever. Negative for chills.  HENT:  Positive for congestion, ear pain and sore throat. Negative for sinus pressure.   Respiratory:  Positive for cough. Negative for chest tightness, shortness of breath, wheezing and  stridor.   Cardiovascular:  Negative for chest pain, palpitations and leg swelling.  Gastrointestinal:  Negative for abdominal distention.  Neurological:  Positive for dizziness and light-headedness.  Hematological:  Negative for adenopathy. Does not bruise/bleed easily.    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   Menorrhagia 01/10/2019   B12 deficiency 01/10/2019   Morbid obesity ([place]) 07/02/2017   Hypertension associated with diabetes ([place]) 07/02/2017   Dyslipidemia 07/02/2017   Iron deficiency anemia 11/08/2015   Essential hypertension 11/02/2015   Type 2 diabetes mellitus without complication, without long-term current use of insulin ([place]) 11/02/2015   Hyperlipidemia 11/02/2015   Overweight 11/02/2015    Allergies  Allergen Reactions   Penicillins Hives   Azithromycin Hives    Past Surgical History:  Procedure Laterality Date   Gastric bypass surgery      Social History   Tobacco Use   Smoking status: Never   Smokeless tobacco: Never  Substance Use Topics   Alcohol use: No    Alcohol/week: 0.0 standard drinks of alcohol   Drug use: No     Medication list has been reviewed and updated.  Current Meds  Medication Sig   aspirin 81 MG tablet Take 1 tablet (81 mg total) by mouth daily.   atorvastatin (LIPITOR) 20 MG tablet Take 1 tablet (20 mg total) by  mouth daily. Solum   ergocalciferol (VITAMIN D2) 1.25 MG (50000 UT) capsule TAKE 1 CAPSULE BY MOUTH 1 TIME A WEEK   ferrous sulfate 325 (65 FE) MG tablet Take 325 mg by mouth daily with breakfast. otc   fluticasone (FLONASE) 50 MCG/ACT nasal spray SHAKE LIQUID AND USE 2 SPRAYS IN EACH NOSTRIL DAILY   losartan-hydrochlorothiazide (HYZAAR) 50-12.5 MG tablet Take 1 tablet by mouth daily.   metFORMIN (GLUCOPHAGE XR) 500 MG 24 hr tablet Take 1 tablet (500 mg total) by mouth daily with breakfast. (Patient taking differently: Take 1,500 mg by  mouth at bedtime. Dr Gabriel Carina)   tirzepatide [facility]) 10 MG/0.5ML Pen Inject into the skin. endo   triamcinolone cream (KENALOG) 0.1 % Apply 1 Application topically 2 (two) times daily.       06/06/2022    3:57 PM 04/22/2022    9:09 AM 02/26/2022    8:26 AM 02/19/2021   10:07 AM  GAD 7 : Generalized Anxiety Score  Nervous, Anxious, on Edge 0 0 1 1  Control/stop worrying 0 0 0 0  Worry too much - different things 0 0 1 0  Trouble relaxing 0 0 0 0  Restless 0 0 0 0  Easily annoyed or irritable 0 0 0 0  Afraid - awful might happen 0 0 0 0  Total GAD 7 Score 0 0 2 1  Anxiety Difficulty Not difficult at all Not difficult at all Not difficult at all Not difficult at all       06/06/2022    3:57 PM 04/22/2022    9:09 AM 02/26/2022    8:26 AM  Depression screen PHQ 2/9  Decreased Interest 0 0 0  Down, Depressed, Hopeless 0 0 0  PHQ - 2 Score 0 0 0  Altered sleeping 0 0 0  Tired, decreased energy 0 0 1  Change in appetite 0 0 0  Feeling bad or failure about yourself  0 0 0  Trouble concentrating 0 0 0  Moving slowly or fidgety/restless 0 0 0  Suicidal thoughts 0 0 0  PHQ-9 Score 0 0 1  Difficult doing work/chores Not difficult at all Not difficult at all Not difficult at all    BP Readings from Last 3 Encounters:  06/06/22 112/76  04/22/22 128/70  02/26/22 120/80    Physical Exam HENT:     Right Ear: Tympanic membrane and ear canal normal.     Left Ear: Tympanic membrane and ear canal normal.     Nose: No congestion or rhinorrhea.     Mouth/Throat:     Mouth: Mucous membranes are moist.  Eyes:     Pupils: Pupils are equal, round, and reactive to light.  Cardiovascular:     Heart sounds: No murmur heard.    No gallop.  Pulmonary:     Breath sounds: No wheezing, rhonchi or rales.  Abdominal:     Tenderness: There is no abdominal tenderness.  Neurological:     Mental Status: She is alert.     Motor: No weakness.     Coordination: Coordination normal.     Wt  Readings from Last 3 Encounters:  06/06/22 233 lb (105.7 kg)  04/22/22 242 lb (109.8 kg)  02/26/22 253 lb (114.8 kg)    BP 112/76   Pulse (!) 102   Temp 98.1 F (36.7 C) (Oral)   Ht _0  (1.6 m)   Wt 233 lb (105.7 kg)   SpO2 98%   BMI 41.27 kg/m  Assessment and Plan: 1. Dehydration New onset.  Mucous membranes dry.  Patient's been encouraged to incorporate more fluids into her regimen including electrolyte replacing beverages such as Gatorade.  2. Acute non-recurrent maxillary sinusitis Acute.  Persistent.  Stable.  Will treat with doxycycline 100 mg twice a day.    Otilio Miu, MD

## 2022-06-07 LAB — VON WILLEBRAND PANEL
Coagulation Factor VIII: 95 % (ref 56–140)
Ristocetin Co-factor, Plasma: 68 % (ref 50–200)
Von Willebrand Antigen, Plasma: 187 % (ref 50–200)

## 2022-06-07 LAB — COAG STUDIES INTERP REPORT

## 2022-06-10 ENCOUNTER — Encounter: Payer: Self-pay | Admitting: Oncology

## 2022-06-10 ENCOUNTER — Inpatient Hospital Stay: Payer: BC Managed Care – PPO

## 2022-06-10 ENCOUNTER — Inpatient Hospital Stay (HOSPITAL_BASED_OUTPATIENT_CLINIC_OR_DEPARTMENT_OTHER): Payer: BC Managed Care – PPO | Admitting: Oncology

## 2022-06-10 VITALS — BP 123/80 | HR 88 | Temp 98.1°F | Wt 230.0 lb

## 2022-06-10 DIAGNOSIS — N920 Excessive and frequent menstruation with regular cycle: Secondary | ICD-10-CM | POA: Diagnosis not present

## 2022-06-10 DIAGNOSIS — K9589 Other complications of other bariatric procedure: Secondary | ICD-10-CM

## 2022-06-10 DIAGNOSIS — Z9884 Bariatric surgery status: Secondary | ICD-10-CM

## 2022-06-10 DIAGNOSIS — D508 Other iron deficiency anemias: Secondary | ICD-10-CM | POA: Diagnosis not present

## 2022-06-10 DIAGNOSIS — D509 Iron deficiency anemia, unspecified: Secondary | ICD-10-CM | POA: Diagnosis not present

## 2022-06-10 MED ORDER — SODIUM CHLORIDE 0.9 % IV SOLN
200.0000 mg | Freq: Once | INTRAVENOUS | Status: AC
  Administered 2022-06-10: 200 mg via INTRAVENOUS
  Filled 2022-06-10: qty 200

## 2022-06-10 MED ORDER — SODIUM CHLORIDE 0.9 % IV SOLN
INTRAVENOUS | Status: DC
  Filled 2022-06-10: qty 250

## 2022-06-10 NOTE — Patient Instructions (Signed)
[facility]  Discharge Instructions: Thank you for choosing [facility] to provide your oncology and hematology care.  If you have a lab appointment with the Cancer Center, please go directly to the Cancer Center and check in at the registration area.  Wear comfortable clothing and clothing appropriate for easy access to any Portacath or PICC line.   We strive to give you quality time with your provider. You may need to reschedule your appointment if you arrive late (15 or more minutes).  Arriving late affects you and other patients whose appointments are after yours.  Also, if you miss three or more appointments without notifying the office, you may be dismissed from the clinic at the provider's discretion.      For prescription refill requests, have your pharmacy contact our office and allow 72 hours for refills to be completed.    Today you received the following chemotherapy and/or immunotherapy agents venofer      To help prevent nausea and vomiting after your treatment, we encourage you to take your nausea medication as directed.  BELOW ARE SYMPTOMS THAT SHOULD BE REPORTED IMMEDIATELY: *FEVER GREATER THAN 100.4 F (38 C) OR HIGHER *CHILLS OR SWEATING *NAUSEA AND VOMITING THAT IS NOT CONTROLLED WITH YOUR NAUSEA MEDICATION *UNUSUAL SHORTNESS OF BREATH *UNUSUAL BRUISING OR BLEEDING *URINARY PROBLEMS (pain or burning when urinating, or frequent urination) *BOWEL PROBLEMS (unusual diarrhea, constipation, pain near the anus) TENDERNESS IN MOUTH AND THROAT WITH OR WITHOUT PRESENCE OF ULCERS (sore throat, sores in mouth, or a toothache) UNUSUAL RASH, SWELLING OR PAIN  UNUSUAL VAGINAL DISCHARGE OR ITCHING   Items with * indicate a potential emergency and should be followed up as soon as possible or go to the Emergency Department if any problems should occur.  Please show the CHEMOTHERAPY ALERT CARD or IMMUNOTHERAPY ALERT CARD at check-in to the  Emergency Department and triage nurse.  Should you have questions after your visit or need to cancel or reschedule your appointment, please contact [facility]  [phone] and follow the prompts.  Office hours are 8:00 a.m. to 4:30 p.m. Monday - Friday. Please note that voicemails left after 4:00 p.m. may not be returned until the following business day.  We are closed weekends and major holidays. You have access to a nurse at all times for urgent questions. Please call the main number to the clinic [phone] and follow the prompts.  For any non-urgent questions, you may also contact your provider using MyChart. We now offer e-Visits for anyone 18 and older to request care online for non-urgent symptoms. For details visit [email or web address].   Also download the MyChart app! Go to the app store, search "MyChart", open the app, select [facility], and log in with your MyChart username and password.  Masks are optional in the cancer centers. If you would like for your care team to wear a mask while they are taking care of you, please let them know. For doctor visits, patients may have with them one support person who is at least 44 years old. At this time, visitors are not allowed in the infusion area.   

## 2022-06-11 ENCOUNTER — Encounter: Payer: Self-pay | Admitting: Oncology

## 2022-06-11 NOTE — Progress Notes (Signed)
Hematology/Oncology Progress note [phone] [phone]     Duanne Limerick, MD  ASSESSMENT & PLAN:   Iron deficiency anemia Labs reviewed and discussed with patient Hemoglobin is normal at 12. Iron saturation 18, ferritin was not done.  With her history of gastric bypass, recommend 1 dose of Venofer 200mg  x 1   History of gastric bypass IV Venofer maintenance.  B12, folate level are normal Continue monitor periodically.      Orders Placed This Encounter  Procedures   CBC with Differential/Platelet    Standing Status:   Future    Standing Expiration Date:   06/11/2023   Comprehensive metabolic panel    Standing Status:   Future    Standing Expiration Date:   06/10/2023   Iron and TIBC    Standing Status:   Future    Standing Expiration Date:   06/11/2023   Ferritin    Standing Status:   Future    Standing Expiration Date:   06/11/2023   Retic Panel    Standing Status:   Future    Standing Expiration Date:   06/11/2023   Follow-up in 6 months, labs prior to MD +/- Venofer. All questions were answered. The patient knows to call the clinic with any problems, questions or concerns. No barriers to learning was detected.  06/13/2023, MD, PhD [facility] Hematology Oncology 06/10/2022    PERTINENT HEMATOLOGIC HISTORY: Patient previously followed up by Dr.Corcoran, patient switched care to me on 12/04/21 Patient has a history of gastric bypass surgery in 2008, chronic iron deficiency anemia, previously treated with multiple doses of IV Feraheme.  Recently due to change of insurance coverage, switched to IV Venofer treatments and the tolerated well  Patient continues to have menorrhagia.  Reports feeling tired and fatigued. She takes oral iron supplementation daily.  Denies any black or bloody stool. I have reviewed the past medical history, past surgical history, social history and family history with the patient and they are unchanged from previous  note.  INTERVAL HISTORY: Jeanette Flores 46 y.o. female presents for follow up of iron deficiency anemia, history of gastric bypass She reports feeling ok.  She has sinusitis, and her PCP has prescribed doxycycline 100mg  BID.    ALLERGIES:  is allergic to penicillins and azithromycin.  MEDICATIONS:  Current Outpatient Medications  Medication Sig Dispense Refill   aspirin 81 MG tablet Take 1 tablet (81 mg total) by mouth daily. 30 tablet 11   atorvastatin (LIPITOR) 20 MG tablet Take 1 tablet (20 mg total) by mouth daily. Solum 90 tablet 1   doxycycline (VIBRA-TABS) 100 MG tablet Take 1 tablet (100 mg total) by mouth 2 (two) times daily. 20 tablet 0   ergocalciferol (VITAMIN D2) 1.25 MG (50000 UT) capsule TAKE 1 CAPSULE BY MOUTH 1 TIME A WEEK     ferrous sulfate 325 (65 FE) MG tablet Take 325 mg by mouth daily with breakfast. otc     fluticasone (FLONASE) 50 MCG/ACT nasal spray SHAKE LIQUID AND USE 2 SPRAYS IN EACH NOSTRIL DAILY 16 g 5   losartan-hydrochlorothiazide (HYZAAR) 50-12.5 MG tablet Take 1 tablet by mouth daily. 90 tablet 1   metFORMIN (GLUCOPHAGE XR) 500 MG 24 hr tablet Take 1 tablet (500 mg total) by mouth daily with breakfast. (Patient taking differently: Take 1,500 mg by mouth at bedtime. Dr 59) 90 tablet 1   tirzepatide [facility]) 10 MG/0.5ML Pen Inject into the skin. endo     triamcinolone cream (KENALOG) 0.1 %  Apply 1 Application topically 2 (two) times daily. 453.6 g 0   No current facility-administered medications for this visit.     Review of Systems  Constitutional:  Positive for fatigue. Negative for appetite change, chills and fever.  HENT:   Negative for hearing loss and voice change.   Eyes:  Negative for eye problems.  Respiratory:  Negative for chest tightness and cough.   Cardiovascular:  Negative for chest pain.  Gastrointestinal:  Negative for abdominal distention, abdominal pain and blood in stool.  Endocrine: Negative for hot flashes.  Genitourinary:   Positive for menstrual problem. Negative for difficulty urinating and frequency.   Musculoskeletal:  Negative for arthralgias.  Skin:  Negative for itching and rash.  Neurological:  Negative for extremity weakness.  Hematological:  Negative for adenopathy.  Psychiatric/Behavioral:  Negative for confusion.      PHYSICAL EXAMINATION: ECOG PERFORMANCE STATUS: 1 - Symptomatic but completely ambulatory  Vitals:   06/10/22 1419  BP: 123/80  Pulse: 88  Temp: 98.1 F (36.7 C)  SpO2: 100%   Filed Weights   06/10/22 1419  Weight: 230 lb (104.3 kg)    Physical Exam Constitutional:      General: She is not in acute distress.    Appearance: She is obese. She is not diaphoretic.  HENT:     Head: Normocephalic and atraumatic.     Nose: Nose normal.     Mouth/Throat:     Pharynx: No oropharyngeal exudate.  Eyes:     General: No scleral icterus.    Pupils: Pupils are equal, round, and reactive to light.  Cardiovascular:     Rate and Rhythm: Normal rate and regular rhythm.     Heart sounds: No murmur heard. Pulmonary:     Effort: Pulmonary effort is normal. No respiratory distress.     Breath sounds: No rales.  Chest:     Chest wall: No tenderness.  Abdominal:     General: There is no distension.     Palpations: Abdomen is soft.     Tenderness: There is no abdominal tenderness.  Musculoskeletal:        General: Normal range of motion.     Cervical back: Normal range of motion and neck supple.  Skin:    General: Skin is warm and dry.     Findings: No erythema.  Neurological:     Mental Status: She is alert and oriented to person, place, and time.     Cranial Nerves: No cranial nerve deficit.     Motor: No abnormal muscle tone.     Coordination: Coordination normal.  Psychiatric:        Mood and Affect: Affect normal.      LABORATORY DATA:  I have reviewed the data as listed    Latest Ref Rng & Units 06/06/2022   11:39 AM 12/04/2021    1:37 PM 08/27/2021    9:52 AM   CBC  WBC 4.0 - 10.5 K/uL 3.6  6.7  9.4   Hemoglobin 12.0 - 15.0 g/dL 73.2  20.2  54.2   Hematocrit 36.0 - 46.0 % 38.3  37.9  36.0   Platelets 150 - 400 K/uL 309  302  328       Latest Ref Rng & Units 02/26/2022    9:07 AM 12/04/2021    1:37 PM 03/06/2021   12:00 AM  CMP  Glucose 70 - 99 mg/dL 84  706    BUN 6 - 24 mg/dL 15  15  15      Creatinine 0.57 - 1.00 mg/dL 5.63  8.75  0.8      Sodium 134 - 144 mmol/L 137  131  138      Potassium 3.5 - 5.2 mmol/L 4.6  4.2  4.6      Chloride 96 - 106 mmol/L 101  100  103      CO2 20 - 29 mmol/L 20  23    Calcium 8.7 - 10.2 mg/dL 9.4  8.7  9.3      Total Protein 6.5 - 8.1 g/dL  7.7    Total Bilirubin 0.3 - 1.2 mg/dL  0.5    Alkaline Phos 38 - 126 U/L  71    AST 15 - 41 U/L  14    ALT 0 - 44 U/L  13       This result is from an external source.    Iron/TIBC/Ferritin/ %Sat    Component Value Date/Time   IRON 63 06/06/2022 1139   IRON 129 08/23/2013 0827   TIBC 354 06/06/2022 1139   TIBC 432 08/23/2013 0827   FERRITIN 57 12/04/2021 1337   FERRITIN 25 08/23/2013 0827   IRONPCTSAT 18 06/06/2022 1139   IRONPCTSAT 30 08/23/2013 0827       RADIOGRAPHIC STUDIES: I have personally reviewed the radiological images as listed and agreed with the findings in the report. No results found.

## 2022-06-11 NOTE — Assessment & Plan Note (Addendum)
Labs reviewed and discussed with patient Hemoglobin is normal at 12. Iron saturation 18, ferritin was not done.  With her history of gastric bypass, recommend 1 dose of Venofer 200mg  x 1

## 2022-06-11 NOTE — Assessment & Plan Note (Signed)
IV Venofer maintenance.  B12, folate level are normal Continue monitor periodically.

## 2022-08-27 ENCOUNTER — Encounter: Payer: Self-pay | Admitting: Family Medicine

## 2022-08-27 ENCOUNTER — Ambulatory Visit (INDEPENDENT_AMBULATORY_CARE_PROVIDER_SITE_OTHER): Payer: BC Managed Care – PPO | Admitting: Family Medicine

## 2022-08-27 VITALS — BP 110/70 | HR 95 | Ht 65.0 in | Wt 225.0 lb

## 2022-08-27 DIAGNOSIS — E1159 Type 2 diabetes mellitus with other circulatory complications: Secondary | ICD-10-CM

## 2022-08-27 DIAGNOSIS — J301 Allergic rhinitis due to pollen: Secondary | ICD-10-CM

## 2022-08-27 DIAGNOSIS — E785 Hyperlipidemia, unspecified: Secondary | ICD-10-CM | POA: Diagnosis not present

## 2022-08-27 DIAGNOSIS — N920 Excessive and frequent menstruation with regular cycle: Secondary | ICD-10-CM

## 2022-08-27 DIAGNOSIS — I152 Hypertension secondary to endocrine disorders: Secondary | ICD-10-CM

## 2022-08-27 DIAGNOSIS — R5383 Other fatigue: Secondary | ICD-10-CM

## 2022-08-27 MED ORDER — ATORVASTATIN CALCIUM 20 MG PO TABS: 20.0000 mg | ORAL_TABLET | Freq: Every day | ORAL | 1 refills | Status: DC

## 2022-08-27 MED ORDER — FLUTICASONE PROPIONATE 50 MCG/ACT NA SUSP: NASAL | 5 refills | Status: DC

## 2022-08-27 MED ORDER — LOSARTAN POTASSIUM-HCTZ 50-12.5 MG PO TABS: 1.0000 | ORAL_TABLET | Freq: Every day | ORAL | 1 refills | Status: DC

## 2022-08-27 NOTE — Progress Notes (Signed)
Date:  08/27/2022   Name:  Jeanette Flores   DOB:  [birth year]   MRN:  [id number]   Chief Complaint: Hyperlipidemia, Allergic Rhinitis , and Hypertension  Hyperlipidemia This is a chronic problem. The current episode started more than 1 year ago. The problem is controlled. Recent lipid tests were reviewed and are normal. She has no history of chronic renal disease, diabetes, hypothyroidism, liver disease, obesity or nephrotic syndrome. There are no known factors aggravating her hyperlipidemia. Pertinent negatives include no chest pain, focal sensory loss, focal weakness, leg pain, myalgias or shortness of breath. Current antihyperlipidemic treatment includes statins. The current treatment provides moderate improvement of lipids. There are no compliance problems.   Hypertension This is a chronic problem. The current episode started more than 1 year ago. The problem is controlled. Pertinent negatives include no chest pain, orthopnea, palpitations or shortness of breath. Past treatments include angiotensin blockers and diuretics. The current treatment provides moderate improvement. There are no compliance problems.  There is no history of chronic renal disease, a hypertension causing med or renovascular disease.    Lab Results  Component Value Date   NA 137 02/26/2022   K 4.6 02/26/2022   CO2 20 02/26/2022   GLUCOSE 84 02/26/2022   BUN 15 02/26/2022   CREATININE 0.93 02/26/2022   CALCIUM 9.4 02/26/2022   EGFR 78 02/26/2022   GFRNONAA >60 12/04/2021   Lab Results  Component Value Date   CHOL 113 09/06/2021   HDL 29 (A) 09/06/2021   LDLCALC 66 09/06/2021   TRIG 85 09/06/2021   CHOLHDL 5.7 (H) 07/02/2017   No results found for: "TSH" Lab Results  Component Value Date   HGBA1C 6.1 03/14/2022   Lab Results  Component Value Date   WBC 3.6 (L) 06/06/2022   HGB 12.2 06/06/2022   HCT 38.3 06/06/2022   MCV 76.6 (L) 06/06/2022   PLT 309 06/06/2022   Lab Results  Component Value Date    ALT 13 12/04/2021   AST 14 (L) 12/04/2021   ALKPHOS 71 12/04/2021   BILITOT 0.5 12/04/2021   No results found for: "25OHVITD2", "25OHVITD3", "VD25OH"   Review of Systems  Constitutional:  Positive for fatigue.  HENT:  Negative for trouble swallowing.   Eyes:  Negative for visual disturbance.  Respiratory:  Negative for chest tightness, shortness of breath and wheezing.   Cardiovascular:  Negative for chest pain, palpitations, orthopnea and leg swelling.  Gastrointestinal:  Negative for abdominal pain and blood in stool.  Endocrine: Negative for polydipsia and polyuria.  Genitourinary:  Positive for menstrual problem. Negative for difficulty urinating.       Menorragia  Musculoskeletal:  Negative for myalgias.  Neurological:  Negative for focal weakness.  Hematological:  Bruises/bleeds easily.    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   Menorrhagia 01/10/2019   B12 deficiency 01/10/2019   Morbid obesity ([place]) 07/02/2017   Hypertension associated with diabetes ([place]) 07/02/2017   Dyslipidemia 07/02/2017   Iron deficiency anemia 11/08/2015   Essential hypertension 11/02/2015   Type 2 diabetes mellitus without complication, without long-term current use of insulin ([place]) 11/02/2015   Hyperlipidemia 11/02/2015   Overweight 11/02/2015    Allergies  Allergen Reactions   Penicillins Hives   Azithromycin Hives    Past Surgical History:  Procedure Laterality Date   Gastric bypass surgery      Social History   Tobacco Use  Smoking status: Never   Smokeless tobacco: Never  Substance Use Topics   Alcohol use: No    Alcohol/week: 0.0 standard drinks of alcohol   Drug use: No     Medication list has been reviewed and updated.  Current Meds  Medication Sig   aspirin 81 MG tablet Take 1 tablet (81 mg total) by mouth daily.   atorvastatin (LIPITOR) 20 MG tablet Take 1 tablet (20 mg total)  by mouth daily. Solum   ergocalciferol (VITAMIN D2) 1.25 MG (50000 UT) capsule TAKE 1 CAPSULE BY MOUTH 1 TIME A WEEK   ferrous sulfate 325 (65 FE) MG tablet Take 325 mg by mouth daily with breakfast. otc   fluticasone (FLONASE) 50 MCG/ACT nasal spray SHAKE LIQUID AND USE 2 SPRAYS IN EACH NOSTRIL DAILY   losartan-hydrochlorothiazide (HYZAAR) 50-12.5 MG tablet Take 1 tablet by mouth daily.   metFORMIN (GLUCOPHAGE XR) 500 MG 24 hr tablet Take 1 tablet (500 mg total) by mouth daily with breakfast. (Patient taking differently: Take 1,500 mg by mouth at bedtime. Dr Gabriel Carina)   tirzepatide [facility]) 10 MG/0.5ML Pen Inject into the skin. endo   triamcinolone cream (KENALOG) 0.1 % Apply 1 Application topically 2 (two) times daily.       08/27/2022    8:43 AM 06/06/2022    3:57 PM 04/22/2022    9:09 AM 02/26/2022    8:26 AM  GAD 7 : Generalized Anxiety Score  Nervous, Anxious, on Edge 0 0 0 1  Control/stop worrying 0 0 0 0  Worry too much - different things 0 0 0 1  Trouble relaxing 0 0 0 0  Restless 0 0 0 0  Easily annoyed or irritable 0 0 0 0  Afraid - awful might happen 0 0 0 0  Total GAD 7 Score 0 0 0 2  Anxiety Difficulty Not difficult at all Not difficult at all Not difficult at all Not difficult at all       08/27/2022    8:43 AM 06/06/2022    3:57 PM 04/22/2022    9:09 AM  Depression screen PHQ 2/9  Decreased Interest 0 0 0  Down, Depressed, Hopeless 0 0 0  PHQ - 2 Score 0 0 0  Altered sleeping 0 0 0  Tired, decreased energy 0 0 0  Change in appetite 0 0 0  Feeling bad or failure about yourself  0 0 0  Trouble concentrating 0 0 0  Moving slowly or fidgety/restless 0 0 0  Suicidal thoughts 0 0 0  PHQ-9 Score 0 0 0  Difficult doing work/chores Not difficult at all Not difficult at all Not difficult at all    BP Readings from Last 3 Encounters:  08/27/22 110/70  06/10/22 123/80  06/06/22 112/76    Physical Exam Vitals and nursing note reviewed. Exam conducted with a  chaperone present.  Constitutional:      General: She is not in acute distress.    Appearance: She is not diaphoretic.  HENT:     Head: Normocephalic and atraumatic.     Right Ear: External ear normal.     Left Ear: External ear normal.     Nose: Nose normal.  Eyes:     General:        Right eye: No discharge.        Left eye: No discharge.     Conjunctiva/sclera: Conjunctivae normal.     Pupils: Pupils are equal, round, and reactive to light.  Neck:  Thyroid: No thyromegaly.     Vascular: No JVD.  Cardiovascular:     Rate and Rhythm: Normal rate and regular rhythm.     Heart sounds: Normal heart sounds. No murmur heard.    No friction rub. No gallop.  Pulmonary:     Effort: Pulmonary effort is normal.     Breath sounds: Normal breath sounds.  Abdominal:     General: Bowel sounds are normal.     Palpations: Abdomen is soft. There is no mass.     Tenderness: There is no abdominal tenderness. There is no guarding.  Musculoskeletal:        General: Normal range of motion.     Cervical back: Neck supple.  Lymphadenopathy:     Cervical: No cervical adenopathy.  Skin:    General: Skin is warm and dry.  Neurological:     Mental Status: She is alert.     Wt Readings from Last 3 Encounters:  08/27/22 225 lb (102.1 kg)  06/10/22 230 lb (104.3 kg)  06/06/22 233 lb (105.7 kg)    BP 110/70   Pulse 95   Ht '5\' 5"'$  (1.651 m)   Wt 225 lb (102.1 kg)   LMP 07/25/2022 (Exact Date)   SpO2 99%   BMI 37.44 kg/m   Assessment and Plan:  1. 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  Controlled.  Stable.  Blood pressure 110/70.  Tolerating medication well.  Asymptomatic.  Continue losartan hydrochlorothiazide 50-12.5 mg once a day.  Check renal function panel.  Will recheck patient in 6 months. - losartan-hydrochlorothiazide (HYZAAR) 50-12.5 MG tablet; Take 1 tablet by mouth daily.  Dispense: 90 tablet; Refill: 1 - Renal Function Panel  2. Dyslipidemia Chronic.   Controlled.  Stable.  Continue atorvastatin 20 mg once a day.  Recheck lipid panel for current level of control. - Lipid Panel With LDL/HDL Ratio  3. Morbid obesity ([place]) Diabetes is followed by Dr. Gabriel Carina and is currently on [facility] and is tolerating well. - atorvastatin (LIPITOR) 20 MG tablet; Take 1 tablet (20 mg total) by mouth daily. Solum  Dispense: 90 tablet; Refill: 1  4. Seasonal allergic rhinitis due to pollen Chronic.  Controlled.  Stable.  Continue Flonase nasal spray 2 sprays each nostril daily. - fluticasone (FLONASE) 50 MCG/ACT nasal spray; SHAKE LIQUID AND USE 2 SPRAYS IN EACH NOSTRIL DAILY  Dispense: 16 g; Refill: 5  5. Fatigue, unspecified type Patient continues to have some fatigue but is been treated for anemia for iron deficiency and is followed by hematology.  We will check TSH just to make sure that there is no thyroid issue either about overactive or underactive variety. - TSH  6. Menorrhagia with regular cycle Patient during review of system notes that she has heavy periods this may be contributing to her anemia and we will consult gynecology for referral for maintenance, cervical cancer surveillance and evaluation if control of bleeding is necessary. - Ambulatory referral to Gynecology    Otilio Miu, MD

## 2022-08-28 LAB — RENAL FUNCTION PANEL
Albumin: 4 g/dL (ref 3.9–4.9)
BUN/Creatinine Ratio: 21 (ref 9–23)
BUN: 17 mg/dL (ref 6–24)
CO2: 23 mmol/L (ref 20–29)
Calcium: 9.7 mg/dL (ref 8.7–10.2)
Chloride: 100 mmol/L (ref 96–106)
Creatinine, Ser: 0.82 mg/dL (ref 0.57–1.00)
Glucose: 83 mg/dL (ref 70–99)
Phosphorus: 4 mg/dL (ref 3.0–4.3)
Potassium: 4.5 mmol/L (ref 3.5–5.2)
Sodium: 138 mmol/L (ref 134–144)
eGFR: 90 mL/min/{1.73_m2} (ref >59)

## 2022-08-28 LAB — LIPID PANEL WITH LDL/HDL RATIO
Cholesterol, Total: 117 mg/dL (ref 100–199)
HDL: 38 mg/dL — ABNORMAL LOW (ref >39)
LDL Chol Calc (NIH): 66 mg/dL (ref 0–99)
LDL/HDL Ratio: 1.7 ratio (ref 0.0–3.2)
Triglycerides: 61 mg/dL (ref 0–149)
VLDL Cholesterol Cal: 13 mg/dL (ref 5–40)

## 2022-08-28 LAB — TSH: TSH: 2.41 u[IU]/mL (ref 0.450–4.500)

## 2022-09-02 DIAGNOSIS — N6310 Unspecified lump in the right breast, unspecified quadrant: Secondary | ICD-10-CM | POA: Diagnosis not present

## 2022-09-02 DIAGNOSIS — Z1231 Encounter for screening mammogram for malignant neoplasm of breast: Secondary | ICD-10-CM | POA: Diagnosis not present

## 2022-09-02 DIAGNOSIS — N6320 Unspecified lump in the left breast, unspecified quadrant: Secondary | ICD-10-CM | POA: Diagnosis not present

## 2022-09-10 DIAGNOSIS — Z124 Encounter for screening for malignant neoplasm of cervix: Secondary | ICD-10-CM | POA: Diagnosis not present

## 2022-09-10 DIAGNOSIS — Z01419 Encounter for gynecological examination (general) (routine) without abnormal findings: Secondary | ICD-10-CM | POA: Diagnosis not present

## 2022-09-14 LAB — HM DIABETES EYE EXAM

## 2022-09-19 DIAGNOSIS — E669 Obesity, unspecified: Secondary | ICD-10-CM | POA: Diagnosis not present

## 2022-09-19 DIAGNOSIS — E1169 Type 2 diabetes mellitus with other specified complication: Secondary | ICD-10-CM | POA: Diagnosis not present

## 2022-09-19 LAB — HEMOGLOBIN A1C: Hemoglobin A1C: 5.6

## 2022-09-19 LAB — BASIC METABOLIC PANEL
BUN: 25 — AB (ref 4–21)
CO2: 32 — AB (ref 13–22)
Chloride: 109 — AB (ref 99–108)
Creatinine: 1.1 (ref 0.5–1.1)
Glucose: 114
Potassium: 5.1 mEq/L (ref 3.5–5.1)
Sodium: 145 (ref 137–147)

## 2022-09-19 LAB — HEPATIC FUNCTION PANEL
ALT: 38 U/L — AB (ref 7–35)
AST: 39 — AB (ref 13–35)
Alkaline Phosphatase: 104 (ref 25–125)
Bilirubin, Total: 1.2

## 2022-09-19 LAB — MICROALBUMIN / CREATININE URINE RATIO: Microalb Creat Ratio: 11.7

## 2022-09-30 DIAGNOSIS — I10 Essential (primary) hypertension: Secondary | ICD-10-CM | POA: Diagnosis not present

## 2022-09-30 DIAGNOSIS — E782 Mixed hyperlipidemia: Secondary | ICD-10-CM | POA: Diagnosis not present

## 2022-09-30 DIAGNOSIS — E559 Vitamin D deficiency, unspecified: Secondary | ICD-10-CM | POA: Diagnosis not present

## 2022-09-30 DIAGNOSIS — E119 Type 2 diabetes mellitus without complications: Secondary | ICD-10-CM | POA: Diagnosis not present

## 2022-10-22 ENCOUNTER — Ambulatory Visit (INDEPENDENT_AMBULATORY_CARE_PROVIDER_SITE_OTHER): Payer: BC Managed Care – PPO | Admitting: Family Medicine

## 2022-10-22 VITALS — BP 100/62 | HR 92 | Ht 65.0 in | Wt 214.0 lb

## 2022-10-22 DIAGNOSIS — E1159 Type 2 diabetes mellitus with other circulatory complications: Secondary | ICD-10-CM

## 2022-10-22 DIAGNOSIS — I152 Hypertension secondary to endocrine disorders: Secondary | ICD-10-CM

## 2022-10-22 MED ORDER — VALSARTAN 80 MG PO TABS: 80.0000 mg | ORAL_TABLET | Freq: Every day | ORAL | 1 refills | Status: DC

## 2022-10-22 NOTE — Progress Notes (Signed)
Date:  10/22/2022   Name:  Jeanette Flores   DOB:  1975/08/24   MRN:  [id number]   Chief Complaint: Hypertension (Discuss coming down on bp med due to lightheaded when standing up)  Hypertension This is a chronic problem. The current episode started more than 1 year ago. The problem has been gradually improving since onset. The problem is controlled. Pertinent negatives include no anxiety, blurred vision, chest pain, headaches, malaise/fatigue, neck pain, orthopnea, palpitations, peripheral edema, PND, shortness of breath or sweats. There are no associated agents to hypertension. Risk factors for coronary artery disease include dyslipidemia. Past treatments include angiotensin blockers and diuretics. The current treatment provides mild improvement. There are no compliance problems.  There is no history of CAD/MI or CVA. There is no history of chronic renal disease, a hypertension causing med or renovascular disease.    Lab Results  Component Value Date   NA 145 09/19/2022   K 5.1 09/19/2022   CO2 32 (A) 09/19/2022   GLUCOSE 83 08/27/2022   BUN 25 (A) 09/19/2022   CREATININE 1.1 09/19/2022   CALCIUM 9.7 08/27/2022   EGFR 90 08/27/2022   GFRNONAA >60 12/04/2021   Lab Results  Component Value Date   CHOL 117 08/27/2022   HDL 38 (L) 08/27/2022   LDLCALC 66 08/27/2022   TRIG 61 08/27/2022   CHOLHDL 5.7 (H) 07/02/2017   Lab Results  Component Value Date   TSH 2.410 08/27/2022   Lab Results  Component Value Date   HGBA1C 5.6 09/19/2022   Lab Results  Component Value Date   WBC 3.6 (L) 06/06/2022   HGB 12.2 06/06/2022   HCT 38.3 06/06/2022   MCV 76.6 (L) 06/06/2022   PLT 309 06/06/2022   Lab Results  Component Value Date   ALT 38 (A) 09/19/2022   AST 39 (A) 09/19/2022   ALKPHOS 104 09/19/2022   BILITOT 0.5 12/04/2021   No results found for: "25OHVITD2", "25OHVITD3", "VD25OH"   Review of Systems  Constitutional:  Negative for malaise/fatigue.  HENT:  Negative for  trouble swallowing.   Eyes:  Negative for blurred vision and visual disturbance.  Respiratory:  Negative for cough, shortness of breath and wheezing.   Cardiovascular:  Negative for chest pain, palpitations, orthopnea, leg swelling and PND.  Gastrointestinal:  Negative for abdominal pain and blood in stool.  Endocrine: Negative for polydipsia and polyuria.  Genitourinary:  Negative for difficulty urinating and menstrual problem.  Musculoskeletal:  Negative for neck pain.  Skin:  Negative for color change.  Neurological:  Negative for headaches.  Hematological:  Negative for adenopathy.    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   Menorrhagia 01/10/2019   B12 deficiency 01/10/2019   Morbid obesity (HCC) 07/02/2017   Hypertension associated with diabetes (HCC) 07/02/2017   Dyslipidemia 07/02/2017   Iron deficiency anemia 11/08/2015   Essential hypertension 11/02/2015   Type 2 diabetes mellitus without complication, without long-term current use of insulin (HCC) 11/02/2015   Hyperlipidemia 11/02/2015   Overweight 11/02/2015    Allergies  Allergen Reactions   Penicillins Hives   Azithromycin Hives    Past Surgical History:  Procedure Laterality Date   Gastric bypass surgery      Social History   Tobacco Use   Smoking status: Never   Smokeless tobacco: Never  Substance Use Topics   Alcohol use: No    Alcohol/week: 0.0 standard drinks of  alcohol   Drug use: No     Medication list has been reviewed and updated.  Current Meds  Medication Sig   aspirin 81 MG tablet Take 1 tablet (81 mg total) by mouth daily.   atorvastatin (LIPITOR) 20 MG tablet Take 1 tablet (20 mg total) by mouth daily. Solum   ergocalciferol (VITAMIN D2) 1.25 MG (50000 UT) capsule TAKE 1 CAPSULE BY MOUTH 1 TIME A WEEK   ferrous sulfate 325 (65 FE) MG tablet Take 325 mg by mouth daily with breakfast. otc   fluticasone  (FLONASE) 50 MCG/ACT nasal spray SHAKE LIQUID AND USE 2 SPRAYS IN EACH NOSTRIL DAILY   losartan-hydrochlorothiazide (HYZAAR) 50-12.5 MG tablet Take 1 tablet by mouth daily.   metFORMIN (GLUCOPHAGE XR) 500 MG 24 hr tablet Take 1 tablet (500 mg total) by mouth daily with breakfast. (Patient taking differently: Take 1,500 mg by mouth at bedtime. Dr Tedd Sias)   tirzepatide [facility]) 10 MG/0.5ML Pen Inject into the skin. endo   triamcinolone cream (KENALOG) 0.1 % Apply 1 Application topically 2 (two) times daily.       10/22/2022    8:51 AM 08/27/2022    8:43 AM 06/06/2022    3:57 PM 04/22/2022    9:09 AM  GAD 7 : Generalized Anxiety Score  Nervous, Anxious, on Edge 0 0 0 0  Control/stop worrying 0 0 0 0  Worry too much - different things 0 0 0 0  Trouble relaxing 0 0 0 0  Restless 0 0 0 0  Easily annoyed or irritable 0 0 0 0  Afraid - awful might happen 0 0 0 0  Total GAD 7 Score 0 0 0 0  Anxiety Difficulty Not difficult at all Not difficult at all Not difficult at all Not difficult at all       10/22/2022    8:51 AM 08/27/2022    8:43 AM 06/06/2022    3:57 PM  Depression screen PHQ 2/9  Decreased Interest 0 0 0  Down, Depressed, Hopeless 0 0 0  PHQ - 2 Score 0 0 0  Altered sleeping 0 0 0  Tired, decreased energy 0 0 0  Change in appetite 0 0 0  Feeling bad or failure about yourself  0 0 0  Trouble concentrating 0 0 0  Moving slowly or fidgety/restless 0 0 0  Suicidal thoughts 0 0 0  PHQ-9 Score 0 0 0  Difficult doing work/chores Not difficult at all Not difficult at all Not difficult at all    BP Readings from Last 3 Encounters:  10/22/22 100/62  08/27/22 110/70  06/10/22 123/80    Physical Exam Vitals and nursing note reviewed. Exam conducted with a chaperone present.  Constitutional:      General: She is not in acute distress.    Appearance: She is not diaphoretic.  HENT:     Head: Normocephalic and atraumatic.     Right Ear: Tympanic membrane and external ear normal.      Left Ear: Tympanic membrane and external ear normal.     Nose: Nose normal. No congestion or rhinorrhea.     Mouth/Throat:     Mouth: Mucous membranes are moist.  Eyes:     General:        Right eye: No discharge.        Left eye: No discharge.     Conjunctiva/sclera: Conjunctivae normal.     Pupils: Pupils are equal, round, and reactive to light.  Neck:  Thyroid: No thyromegaly.     Vascular: No JVD.  Cardiovascular:     Rate and Rhythm: Normal rate and regular rhythm.     Heart sounds: Normal heart sounds, S1 normal and S2 normal. No murmur heard.    No systolic murmur is present.     No diastolic murmur is present.     No friction rub. No gallop. No S3 or S4 sounds.  Pulmonary:     Effort: Pulmonary effort is normal.     Breath sounds: Normal breath sounds. No wheezing, rhonchi or rales.  Abdominal:     General: Bowel sounds are normal.     Palpations: Abdomen is soft. There is no mass.     Tenderness: There is no abdominal tenderness. There is no guarding.  Musculoskeletal:     Cervical back: Neck supple.     Right lower leg: No edema.     Left lower leg: No edema.  Lymphadenopathy:     Cervical: No cervical adenopathy.  Skin:    General: Skin is warm.  Neurological:     Mental Status: She is alert.     Deep Tendon Reflexes: Reflexes are normal and symmetric.     Wt Readings from Last 3 Encounters:  10/22/22 214 lb (97.1 kg)  08/27/22 225 lb (102.1 kg)  06/10/22 230 lb (104.3 kg)    BP 100/62   Pulse 92   Ht 5\' 5"  (1.651 m)   Wt 214 lb (97.1 kg)   LMP 09/26/2022 (Approximate)   SpO2 99%   BMI 35.61 kg/m   Assessment and Plan:  1. Hypertension associated with diabetes (HCC) Chronic.  Controlled.  Stable.  Blood pressure is in the 100/60 range status post weight loss with control of her diabetes with Mounjaro.  Patient is having some lightheadedness with change in position so we will make a change in her current dosing of losartan and  hydrochlorothiazide by discontinuance and put her on valsartan 80 mg once a day because insurance is not paying for losartan as that alone medication.  Will recheck blood pressure in 6 months.   Elizabeth Sauer, MD

## 2022-12-10 ENCOUNTER — Inpatient Hospital Stay: Payer: BC Managed Care – PPO | Attending: Oncology

## 2022-12-10 DIAGNOSIS — N920 Excessive and frequent menstruation with regular cycle: Secondary | ICD-10-CM | POA: Insufficient documentation

## 2022-12-10 DIAGNOSIS — Z9884 Bariatric surgery status: Secondary | ICD-10-CM | POA: Diagnosis not present

## 2022-12-10 DIAGNOSIS — D509 Iron deficiency anemia, unspecified: Secondary | ICD-10-CM | POA: Insufficient documentation

## 2022-12-10 DIAGNOSIS — D508 Other iron deficiency anemias: Secondary | ICD-10-CM

## 2022-12-10 LAB — CBC WITH DIFFERENTIAL/PLATELET
Abs Immature Granulocytes: 0.01 10*3/uL (ref 0.00–0.07)
Basophils Absolute: 0.1 10*3/uL (ref 0.0–0.1)
Basophils Relative: 1 %
Eosinophils Absolute: 0.4 10*3/uL (ref 0.0–0.5)
Eosinophils Relative: 7 %
HCT: 37.5 % (ref 36.0–46.0)
Hemoglobin: 11.7 g/dL — ABNORMAL LOW (ref 12.0–15.0)
Immature Granulocytes: 0 %
Lymphocytes Relative: 23 %
Lymphs Abs: 1.3 10*3/uL (ref 0.7–4.0)
MCH: 24.4 pg — ABNORMAL LOW (ref 26.0–34.0)
MCHC: 31.2 g/dL (ref 30.0–36.0)
MCV: 78.1 fL — ABNORMAL LOW (ref 80.0–100.0)
Monocytes Absolute: 0.5 10*3/uL (ref 0.1–1.0)
Monocytes Relative: 8 %
Neutro Abs: 3.6 10*3/uL (ref 1.7–7.7)
Neutrophils Relative %: 61 %
Platelets: 345 10*3/uL (ref 150–400)
RBC: 4.8 MIL/uL (ref 3.87–5.11)
RDW: 16.6 % — ABNORMAL HIGH (ref 11.5–15.5)
WBC: 5.9 10*3/uL (ref 4.0–10.5)
nRBC: 0 % (ref 0.0–0.2)

## 2022-12-10 LAB — COMPREHENSIVE METABOLIC PANEL
ALT: 15 U/L (ref 0–44)
AST: 16 U/L (ref 15–41)
Albumin: 3.9 g/dL (ref 3.5–5.0)
Alkaline Phosphatase: 63 U/L (ref 38–126)
Anion gap: 8 (ref 5–15)
BUN: 17 mg/dL (ref 6–20)
CO2: 26 mmol/L (ref 22–32)
Calcium: 8.9 mg/dL (ref 8.9–10.3)
Chloride: 102 mmol/L (ref 98–111)
Creatinine, Ser: 0.76 mg/dL (ref 0.44–1.00)
GFR, Estimated: >60 mL/min (ref >60)
Glucose, Bld: 85 mg/dL (ref 70–99)
Potassium: 4.4 mmol/L (ref 3.5–5.1)
Sodium: 136 mmol/L (ref 135–145)
Total Bilirubin: 0.6 mg/dL (ref 0.3–1.2)
Total Protein: 7.3 g/dL (ref 6.5–8.1)

## 2022-12-10 LAB — RETIC PANEL
Immature Retic Fract: 10.7 % (ref 2.3–15.9)
RBC.: 4.73 MIL/uL (ref 3.87–5.11)
Retic Count, Absolute: 45.9 10*3/uL (ref 19.0–186.0)
Retic Ct Pct: 1 % (ref 0.4–3.1)
Reticulocyte Hemoglobin: 27.2 pg — ABNORMAL LOW (ref >27.9)

## 2022-12-10 LAB — IRON AND TIBC
Iron: 59 ug/dL (ref 28–170)
Saturation Ratios: 15 % (ref 10.4–31.8)
TIBC: 396 ug/dL (ref 250–450)
UIBC: 337 ug/dL

## 2022-12-10 LAB — FERRITIN: Ferritin: 27 ng/mL (ref 11–307)

## 2022-12-12 ENCOUNTER — Encounter: Payer: Self-pay | Admitting: Oncology

## 2022-12-12 ENCOUNTER — Inpatient Hospital Stay (HOSPITAL_BASED_OUTPATIENT_CLINIC_OR_DEPARTMENT_OTHER): Payer: BC Managed Care – PPO | Admitting: Oncology

## 2022-12-12 ENCOUNTER — Inpatient Hospital Stay: Payer: BC Managed Care – PPO

## 2022-12-12 VITALS — BP 95/70 | HR 90 | Resp 18

## 2022-12-12 VITALS — BP 99/73 | HR 98 | Temp 98.5°F | Resp 18 | Wt 206.6 lb

## 2022-12-12 DIAGNOSIS — D509 Iron deficiency anemia, unspecified: Secondary | ICD-10-CM | POA: Diagnosis not present

## 2022-12-12 DIAGNOSIS — Z9884 Bariatric surgery status: Secondary | ICD-10-CM

## 2022-12-12 DIAGNOSIS — N920 Excessive and frequent menstruation with regular cycle: Secondary | ICD-10-CM | POA: Diagnosis not present

## 2022-12-12 DIAGNOSIS — D508 Other iron deficiency anemias: Secondary | ICD-10-CM

## 2022-12-12 MED ORDER — SODIUM CHLORIDE 0.9 % IV SOLN
200.0000 mg | Freq: Once | INTRAVENOUS | Status: AC
  Administered 2022-12-12: 200 mg via INTRAVENOUS
  Filled 2022-12-12: qty 200

## 2022-12-12 MED ORDER — SODIUM CHLORIDE 0.9 % IV SOLN
INTRAVENOUS | Status: DC
  Filled 2022-12-12: qty 250

## 2022-12-12 NOTE — Assessment & Plan Note (Addendum)
Labs reviewed and discussed with patient Lab Results  Component Value Date   HGB 11.7 (L) 12/10/2022   TIBC 396 12/10/2022   IRONPCTSAT 15 12/10/2022   FERRITIN 27 12/10/2022     With her history of gastric bypass and heavy menstrual period, recommend Venofer weekly x 2. Recommend pregnancy testing prior to Venofer.  Patient declines.

## 2022-12-12 NOTE — Progress Notes (Signed)
Hematology/Oncology Progress note [phone] [phone]     Duanne Limerick, MD  ASSESSMENT & PLAN:   Iron deficiency anemia Labs reviewed and discussed with patient Lab Results  Component Value Date   HGB 11.7 (L) 12/10/2022   TIBC 396 12/10/2022   IRONPCTSAT 15 12/10/2022   FERRITIN 27 12/10/2022     With her history of gastric bypass and heavy menstrual period, recommend Venofer weekly x 2. Recommend pregnancy testing prior to Venofer.  Patient declines.   History of gastric bypass IV Venofer maintenance.  Continue monitor B12 and folate periodically.       Orders Placed This Encounter  Procedures   CBC with Differential (Cancer Center Only)    Standing Status:   Future    Standing Expiration Date:   12/12/2023   Iron and TIBC    Standing Status:   Future    Standing Expiration Date:   12/12/2023   Ferritin    Standing Status:   Future    Standing Expiration Date:   12/12/2023   Vitamin B12    Standing Status:   Future    Standing Expiration Date:   12/12/2023   Folate    Standing Status:   Future    Standing Expiration Date:   12/12/2023   Retic Panel    Standing Status:   Future    Standing Expiration Date:   12/12/2023   Follow-up in 6 months, labs prior to MD +/- Venofer. All questions were answered. The patient knows to call the clinic with any problems, questions or concerns. No barriers to learning was detected.  Rickard Patience, MD, PhD [facility] Hematology Oncology 12/12/2022    PERTINENT HEMATOLOGIC HISTORY: Patient previously followed up by Dr.Corcoran, patient switched care to me on 12/04/21 Patient has a history of gastric bypass surgery in 2008, chronic iron deficiency anemia, previously treated with multiple doses of IV Feraheme.  Recently due to change of insurance coverage, switched to IV Venofer treatments and the tolerated well  Patient continues to have menorrhagia.  Reports feeling tired and fatigued. She takes oral  iron supplementation daily.  Denies any black or bloody stool. I have reviewed the past medical history, past surgical history, social history and family history with the patient and they are unchanged from previous note.  INTERVAL HISTORY: Jeanette Flores 47 y.o. female presents for follow up of iron deficiency anemia, history of gastric bypass She reports feeling ok.  Menstrual period remains heavy.  ALLERGIES:  is allergic to penicillins and azithromycin.  MEDICATIONS:  Current Outpatient Medications  Medication Sig Dispense Refill   aspirin 81 MG tablet Take 1 tablet (81 mg total) by mouth daily. 30 tablet 11   atorvastatin (LIPITOR) 20 MG tablet Take 1 tablet (20 mg total) by mouth daily. Solum 90 tablet 1   ergocalciferol (VITAMIN D2) 1.25 MG (50000 UT) capsule TAKE 1 CAPSULE BY MOUTH 1 TIME A WEEK     ferrous sulfate 325 (65 FE) MG tablet Take 325 mg by mouth daily with breakfast. otc     fluticasone (FLONASE) 50 MCG/ACT nasal spray SHAKE LIQUID AND USE 2 SPRAYS IN EACH NOSTRIL DAILY 16 g 5   metFORMIN (GLUCOPHAGE XR) 500 MG 24 hr tablet Take 1 tablet (500 mg total) by mouth daily with breakfast. (Patient taking differently: Take 1,500 mg by mouth at bedtime. Dr Tedd Sias) 90 tablet 1   tirzepatide [facility]) 10 MG/0.5ML Pen Inject into the skin. endo     triamcinolone cream (KENALOG)  0.1 % Apply 1 Application topically 2 (two) times daily. 453.6 g 0   valsartan (DIOVAN) 80 MG tablet Take 1 tablet (80 mg total) by mouth daily. 90 tablet 1   No current facility-administered medications for this visit.   Facility-Administered Medications Ordered in Other Visits  Medication Dose Route Frequency Provider Last Rate Last Admin   0.9 %  sodium chloride infusion   Intravenous Continuous Rickard Patience, MD   Stopped at 12/12/22 1608     Review of Systems  Constitutional:  Positive for fatigue. Negative for appetite change, chills and fever.  HENT:   Negative for hearing loss and voice change.    Eyes:  Negative for eye problems.  Respiratory:  Negative for chest tightness and cough.   Cardiovascular:  Negative for chest pain.  Gastrointestinal:  Negative for abdominal distention, abdominal pain and blood in stool.  Endocrine: Negative for hot flashes.  Genitourinary:  Positive for menstrual problem. Negative for difficulty urinating and frequency.   Musculoskeletal:  Negative for arthralgias.  Skin:  Negative for itching and rash.  Neurological:  Negative for extremity weakness.  Hematological:  Negative for adenopathy.  Psychiatric/Behavioral:  Negative for confusion.      PHYSICAL EXAMINATION: ECOG PERFORMANCE STATUS: 1 - Symptomatic but completely ambulatory  Vitals:   12/12/22 1442  BP: 99/73  Pulse: 98  Resp: 18  Temp: 98.5 F (36.9 C)  SpO2: 100%   Filed Weights   12/12/22 1442  Weight: 206 lb 9.6 oz (93.7 kg)    Physical Exam Constitutional:      General: She is not in acute distress.    Appearance: She is obese. She is not diaphoretic.  HENT:     Head: Normocephalic and atraumatic.  Eyes:     General: No scleral icterus.    Pupils: Pupils are equal, round, and reactive to light.  Cardiovascular:     Rate and Rhythm: Normal rate and regular rhythm.     Heart sounds: Murmur heard.  Pulmonary:     Effort: Pulmonary effort is normal. No respiratory distress.     Breath sounds: No wheezing.  Abdominal:     General: There is no distension.  Musculoskeletal:        General: Normal range of motion.     Cervical back: Normal range of motion and neck supple.  Skin:    General: Skin is dry.     Findings: No erythema.  Neurological:     Mental Status: She is alert and oriented to person, place, and time. Mental status is at baseline.     Cranial Nerves: No cranial nerve deficit.     Motor: No abnormal muscle tone.  Psychiatric:        Mood and Affect: Mood and affect normal.      LABORATORY DATA:  I have reviewed the data as listed    Latest  Ref Rng & Units 12/10/2022   10:49 AM 06/06/2022   11:39 AM 12/04/2021    1:37 PM  CBC  WBC 4.0 - 10.5 K/uL 5.9  3.6  6.7   Hemoglobin 12.0 - 15.0 g/dL 13.2  44.0  10.2   Hematocrit 36.0 - 46.0 % 37.5  38.3  37.9   Platelets 150 - 400 K/uL 345  309  302       Latest Ref Rng & Units 12/10/2022   10:49 AM 09/19/2022   12:00 AM 08/27/2022    9:24 AM  CMP  Glucose 70 - 99 mg/dL  85   83   BUN 6 - 20 mg/dL 17  25     17    Creatinine 0.44 - 1.00 mg/dL 2.95  1.1     6.21   Sodium 135 - 145 mmol/L 136  145     138   Potassium 3.5 - 5.1 mmol/L 4.4  5.1     4.5   Chloride 98 - 111 mmol/L 102  109     100   CO2 22 - 32 mmol/L 26  32     23   Calcium 8.9 - 10.3 mg/dL 8.9   9.7   Total Protein 6.5 - 8.1 g/dL 7.3     Total Bilirubin 0.3 - 1.2 mg/dL 0.6     Alkaline Phos 38 - 126 U/L 63  104       AST 15 - 41 U/L 16  39       ALT 0 - 44 U/L 15  38          This result is from an external source.    Iron/TIBC/Ferritin/ %Sat    Component Value Date/Time   IRON 59 12/10/2022 1049   IRON 129 08/23/2013 0827   TIBC 396 12/10/2022 1049   TIBC 432 08/23/2013 0827   FERRITIN 27 12/10/2022 1049   FERRITIN 25 08/23/2013 0827   IRONPCTSAT 15 12/10/2022 1049   IRONPCTSAT 30 08/23/2013 0827       RADIOGRAPHIC STUDIES: I have personally reviewed the radiological images as listed and agreed with the findings in the report. No results found.

## 2022-12-12 NOTE — Assessment & Plan Note (Signed)
IV Venofer maintenance.  Continue monitor B12 and folate periodically.

## 2022-12-18 MED FILL — Iron Sucrose Inj 20 MG/ML (Fe Equiv): INTRAVENOUS | Qty: 10 | Status: AC

## 2022-12-19 ENCOUNTER — Inpatient Hospital Stay: Payer: BC Managed Care – PPO

## 2022-12-19 VITALS — BP 99/70 | HR 93 | Temp 99.2°F | Resp 18

## 2022-12-19 DIAGNOSIS — N920 Excessive and frequent menstruation with regular cycle: Secondary | ICD-10-CM | POA: Diagnosis not present

## 2022-12-19 DIAGNOSIS — D509 Iron deficiency anemia, unspecified: Secondary | ICD-10-CM

## 2022-12-19 DIAGNOSIS — Z9884 Bariatric surgery status: Secondary | ICD-10-CM | POA: Diagnosis not present

## 2022-12-19 MED ORDER — SODIUM CHLORIDE 0.9 % IV SOLN
Freq: Once | INTRAVENOUS | Status: AC
  Filled 2022-12-19: qty 250

## 2022-12-19 MED ORDER — SODIUM CHLORIDE 0.9 % IV SOLN
200.0000 mg | Freq: Once | INTRAVENOUS | Status: AC
  Administered 2022-12-19: 200 mg via INTRAVENOUS
  Filled 2022-12-19: qty 200

## 2023-02-05 DIAGNOSIS — Z0100 Encounter for examination of eyes and vision without abnormal findings: Secondary | ICD-10-CM | POA: Diagnosis not present

## 2023-02-05 DIAGNOSIS — H43393 Other vitreous opacities, bilateral: Secondary | ICD-10-CM | POA: Diagnosis not present

## 2023-02-05 DIAGNOSIS — H5015 Alternating exotropia: Secondary | ICD-10-CM | POA: Diagnosis not present

## 2023-02-05 DIAGNOSIS — H538 Other visual disturbances: Secondary | ICD-10-CM | POA: Diagnosis not present

## 2023-02-27 ENCOUNTER — Ambulatory Visit (INDEPENDENT_AMBULATORY_CARE_PROVIDER_SITE_OTHER): Payer: BC Managed Care – PPO | Admitting: Family Medicine

## 2023-02-27 ENCOUNTER — Encounter: Payer: Self-pay | Admitting: Family Medicine

## 2023-02-27 VITALS — BP 102/74 | HR 90 | Ht 65.0 in | Wt 201.0 lb

## 2023-02-27 DIAGNOSIS — E1159 Type 2 diabetes mellitus with other circulatory complications: Secondary | ICD-10-CM | POA: Diagnosis not present

## 2023-02-27 DIAGNOSIS — I358 Other nonrheumatic aortic valve disorders: Secondary | ICD-10-CM

## 2023-02-27 DIAGNOSIS — J301 Allergic rhinitis due to pollen: Secondary | ICD-10-CM

## 2023-02-27 DIAGNOSIS — E785 Hyperlipidemia, unspecified: Secondary | ICD-10-CM | POA: Diagnosis not present

## 2023-02-27 DIAGNOSIS — Z7984 Long term (current) use of oral hypoglycemic drugs: Secondary | ICD-10-CM

## 2023-02-27 DIAGNOSIS — I152 Hypertension secondary to endocrine disorders: Secondary | ICD-10-CM

## 2023-02-27 MED ORDER — VALSARTAN 80 MG PO TABS
80.0000 mg | ORAL_TABLET | Freq: Every day | ORAL | 1 refills | Status: DC
Start: 2023-02-27 — End: 2023-08-18

## 2023-02-27 MED ORDER — ATORVASTATIN CALCIUM 20 MG PO TABS
20.0000 mg | ORAL_TABLET | Freq: Every day | ORAL | 1 refills | Status: DC
Start: 2023-02-27 — End: 2023-08-18

## 2023-02-27 MED ORDER — FLUTICASONE PROPIONATE 50 MCG/ACT NA SUSP
NASAL | 5 refills | Status: DC
Start: 2023-02-27 — End: 2023-08-18

## 2023-02-27 NOTE — Progress Notes (Signed)
Date:  02/27/2023   Name:  Jeanette Flores   DOB:  Sep 18, 1975   MRN:  [id number]   Chief Complaint: Hyperlipidemia, Hypertension, and Allergic Rhinitis   Hyperlipidemia This is a chronic problem. The current episode started more than 1 year ago. Recent lipid tests were reviewed and are normal. She has no history of chronic renal disease or diabetes. Pertinent negatives include no chest pain, leg pain, myalgias or shortness of breath. Current antihyperlipidemic treatment includes statins. The current treatment provides moderate improvement of lipids. There are no compliance problems.   Hypertension This is a chronic problem. The current episode started more than 1 year ago. The problem has been gradually improving since onset. The problem is controlled. Pertinent negatives include no anxiety, chest pain, malaise/fatigue, neck pain, orthopnea, palpitations, peripheral edema or shortness of breath. There are no associated agents to hypertension. Risk factors for coronary artery disease include dyslipidemia. Past treatments include angiotensin blockers. The current treatment provides moderate improvement. There are no compliance problems.  There is no history of kidney disease, CAD/MI or CVA. There is no history of chronic renal disease, a hypertension causing med or renovascular disease.  URI  This is a chronic (allergic rhinitis) problem. The current episode started more than 1 year ago. The problem has been gradually improving. Pertinent negatives include no chest pain, diarrhea, ear pain, joint swelling, nausea, neck pain, rash, sneezing, sore throat or wheezing.    Lab Results  Component Value Date   NA 136 12/10/2022   K 4.4 12/10/2022   CO2 26 12/10/2022   GLUCOSE 85 12/10/2022   BUN 17 12/10/2022   CREATININE 0.76 12/10/2022   CALCIUM 8.9 12/10/2022   EGFR 90 08/27/2022   GFRNONAA >60 12/10/2022   Lab Results  Component Value Date   CHOL 117 08/27/2022   HDL 38 (L) 08/27/2022    LDLCALC 66 08/27/2022   TRIG 61 08/27/2022   CHOLHDL 5.7 (H) 07/02/2017   Lab Results  Component Value Date   TSH 2.410 08/27/2022   Lab Results  Component Value Date   HGBA1C 5.6 09/19/2022   Lab Results  Component Value Date   WBC 5.9 12/10/2022   HGB 11.7 (L) 12/10/2022   HCT 37.5 12/10/2022   MCV 78.1 (L) 12/10/2022   PLT 345 12/10/2022   Lab Results  Component Value Date   ALT 15 12/10/2022   AST 16 12/10/2022   ALKPHOS 63 12/10/2022   BILITOT 0.6 12/10/2022   No results found for: "25OHVITD2", "25OHVITD3", "VD25OH"   Review of Systems  Constitutional:  Negative for malaise/fatigue.  HENT:  Negative for ear pain, sneezing and sore throat.   Respiratory:  Negative for choking, shortness of breath and wheezing.   Cardiovascular:  Negative for chest pain, palpitations and orthopnea.  Gastrointestinal:  Negative for diarrhea and nausea.  Endocrine: Negative for polydipsia and polyuria.  Musculoskeletal:  Negative for myalgias and neck pain.  Skin:  Negative for rash.    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   Menorrhagia 01/10/2019   B12 deficiency 01/10/2019   Morbid obesity (HCC) 07/02/2017   Hypertension associated with diabetes (HCC) 07/02/2017   Dyslipidemia 07/02/2017   Iron deficiency anemia 11/08/2015   Essential hypertension 11/02/2015   Type 2 diabetes mellitus without complication, without long-term current use of insulin (HCC) 11/02/2015   Hyperlipidemia 11/02/2015   Overweight 11/02/2015    Allergies  Allergen  Reactions   Penicillins Hives   Azithromycin Hives    Past Surgical History:  Procedure Laterality Date   Gastric bypass surgery      Social History   Tobacco Use   Smoking status: Never   Smokeless tobacco: Never  Substance Use Topics   Alcohol use: No    Alcohol/week: 0.0 standard drinks of alcohol   Drug use: No     Medication list has  been reviewed and updated.  Current Meds  Medication Sig   aspirin 81 MG tablet Take 1 tablet (81 mg total) by mouth daily.   atorvastatin (LIPITOR) 20 MG tablet Take 1 tablet (20 mg total) by mouth daily. Solum   ergocalciferol (VITAMIN D2) 1.25 MG (50000 UT) capsule TAKE 1 CAPSULE BY MOUTH 1 TIME A WEEK   ferrous sulfate 325 (65 FE) MG tablet Take 325 mg by mouth daily with breakfast. otc   fluticasone (FLONASE) 50 MCG/ACT nasal spray SHAKE LIQUID AND USE 2 SPRAYS IN EACH NOSTRIL DAILY   metFORMIN (GLUCOPHAGE XR) 500 MG 24 hr tablet Take 1 tablet (500 mg total) by mouth daily with breakfast. (Patient taking differently: Take 1,500 mg by mouth at bedtime. Dr Tedd Sias)   tirzepatide [facility]) 10 MG/0.5ML Pen Inject into the skin. endo   triamcinolone cream (KENALOG) 0.1 % Apply 1 Application topically 2 (two) times daily.   valsartan (DIOVAN) 80 MG tablet Take 1 tablet (80 mg total) by mouth daily.       02/27/2023    9:01 AM 10/22/2022    8:51 AM 08/27/2022    8:43 AM 06/06/2022    3:57 PM  GAD 7 : Generalized Anxiety Score  Nervous, Anxious, on Edge 0 0 0 0  Control/stop worrying 0 0 0 0  Worry too much - different things 0 0 0 0  Trouble relaxing 0 0 0 0  Restless 0 0 0 0  Easily annoyed or irritable 0 0 0 0  Afraid - awful might happen 0 0 0 0  Total GAD 7 Score 0 0 0 0  Anxiety Difficulty Not difficult at all Not difficult at all Not difficult at all Not difficult at all       02/27/2023    9:00 AM 10/22/2022    8:51 AM 08/27/2022    8:43 AM  Depression screen PHQ 2/9  Decreased Interest 0 0 0  Down, Depressed, Hopeless 0 0 0  PHQ - 2 Score 0 0 0  Altered sleeping 0 0 0  Tired, decreased energy 0 0 0  Change in appetite 0 0 0  Feeling bad or failure about yourself  0 0 0  Trouble concentrating 0 0 0  Moving slowly or fidgety/restless 0 0 0  Suicidal thoughts 0 0 0  PHQ-9 Score 0 0 0  Difficult doing work/chores Not difficult at all Not difficult at all Not difficult at all     BP Readings from Last 3 Encounters:  02/27/23 102/74  12/19/22 99/70  12/12/22 95/70    Physical Exam Vitals and nursing note reviewed. Exam conducted with a chaperone present.  Constitutional:      General: She is not in acute distress.    Appearance: She is not diaphoretic.  HENT:     Head: Normocephalic and atraumatic.     Right Ear: Tympanic membrane and external ear normal.     Left Ear: Tympanic membrane and external ear normal.     Nose: Nose normal.  Eyes:  General:        Right eye: No discharge.        Left eye: No discharge.     Conjunctiva/sclera: Conjunctivae normal.     Pupils: Pupils are equal, round, and reactive to light.  Neck:     Thyroid: No thyromegaly.     Vascular: No hepatojugular reflux or JVD.  Cardiovascular:     Rate and Rhythm: Normal rate and regular rhythm.     Chest Wall: PMI is not displaced.     Heart sounds: S1 normal and S2 normal. Murmur heard.     Systolic murmur is present with a grade of 3/6.     No friction rub. No gallop. No S3 or S4 sounds.     Comments: Loudest over aortic/noted over mitral/ radiates to carotid Pulmonary:     Effort: Pulmonary effort is normal.     Breath sounds: Normal breath sounds. No decreased breath sounds, wheezing, rhonchi or rales.  Abdominal:     General: Bowel sounds are normal.     Palpations: Abdomen is soft.     Tenderness: There is no abdominal tenderness.  Musculoskeletal:        General: Normal range of motion.     Cervical back: Normal range of motion and neck supple.  Lymphadenopathy:     Cervical: No cervical adenopathy.  Skin:    General: Skin is warm and dry.  Neurological:     Mental Status: She is alert.     Wt Readings from Last 3 Encounters:  02/27/23 201 lb (91.2 kg)  12/12/22 206 lb 9.6 oz (93.7 kg)  10/22/22 214 lb (97.1 kg)    BP 102/74   Pulse 90   Ht 5\' 5"  (1.651 m)   Wt 201 lb (91.2 kg)   LMP 02/08/2023 (Exact Date)   SpO2 99%   BMI 33.45 kg/m    Assessment and Plan: 1. Hypertension associated with diabetes (HCC) Chronic.  Controlled.  Stable.  Asymptomatic.  Tolerating medication well.  Patient is doing incredibly well with her weight loss.  Valsartan 80 mg will continue to be prescribed seeing that her systolic is under well-controlled but diastolic is in the upper 70s just under the 80.  Will continue at current dosing and recheck in 6 months. - valsartan (DIOVAN) 80 MG tablet; Take 1 tablet (80 mg total) by mouth daily.  Dispense: 90 tablet; Refill: 1  2. Dyslipidemia Chronic.  Controlled.  Stable.  Continue atorvastatin 20 mg once a day.  Will check lipid when labs are available.  3. Seasonal allergic rhinitis due to pollen Chronic.  Episodic.  Seasonal.  Continue Flonase nasal spray as needed. - fluticasone (FLONASE) 50 MCG/ACT nasal spray; SHAKE LIQUID AND USE 2 SPRAYS IN EACH NOSTRIL DAILY  Dispense: 16 g; Refill: 5  4. Morbid obesity (HCC) Currently on Mounjaro per endocrinology for diabetes control but with excellent weight loss we will continue at current dosing of endocrine prescribed dosage. - atorvastatin (LIPITOR) 20 MG tablet; Take 1 tablet (20 mg total) by mouth daily. Solum  Dispense: 90 tablet; Refill: 1  5. Systolic murmur of aorta Chronic.  Has been noted.  Particularly is more pronounced in the 2-3 range.  There is some radiation to the carotid and it is heard over the aortic as well as the mitral so it could either be a mitral regurg versus the possibility of aortic stenosis that we will consider.  We will refer to cardiology for evaluation and to rule  out pathologic concern of the murmur. - Ambulatory referral to Cardiology     Elizabeth Sauer, MD

## 2023-03-10 NOTE — Telephone Encounter (Signed)
Please review.  KP

## 2023-03-11 ENCOUNTER — Encounter: Payer: Self-pay | Admitting: Family Medicine

## 2023-03-18 DIAGNOSIS — Z0100 Encounter for examination of eyes and vision without abnormal findings: Secondary | ICD-10-CM | POA: Diagnosis not present

## 2023-03-18 DIAGNOSIS — H04123 Dry eye syndrome of bilateral lacrimal glands: Secondary | ICD-10-CM | POA: Diagnosis not present

## 2023-03-26 DIAGNOSIS — E1159 Type 2 diabetes mellitus with other circulatory complications: Secondary | ICD-10-CM | POA: Diagnosis not present

## 2023-03-26 DIAGNOSIS — E785 Hyperlipidemia, unspecified: Secondary | ICD-10-CM | POA: Diagnosis not present

## 2023-03-26 DIAGNOSIS — R011 Cardiac murmur, unspecified: Secondary | ICD-10-CM | POA: Diagnosis not present

## 2023-03-26 DIAGNOSIS — E119 Type 2 diabetes mellitus without complications: Secondary | ICD-10-CM | POA: Diagnosis not present

## 2023-03-27 DIAGNOSIS — E559 Vitamin D deficiency, unspecified: Secondary | ICD-10-CM | POA: Diagnosis not present

## 2023-03-27 DIAGNOSIS — E119 Type 2 diabetes mellitus without complications: Secondary | ICD-10-CM | POA: Diagnosis not present

## 2023-03-31 DIAGNOSIS — R011 Cardiac murmur, unspecified: Secondary | ICD-10-CM | POA: Diagnosis not present

## 2023-04-03 DIAGNOSIS — E119 Type 2 diabetes mellitus without complications: Secondary | ICD-10-CM | POA: Diagnosis not present

## 2023-04-03 DIAGNOSIS — I10 Essential (primary) hypertension: Secondary | ICD-10-CM | POA: Diagnosis not present

## 2023-04-03 DIAGNOSIS — E782 Mixed hyperlipidemia: Secondary | ICD-10-CM | POA: Diagnosis not present

## 2023-04-23 DIAGNOSIS — I350 Nonrheumatic aortic (valve) stenosis: Secondary | ICD-10-CM | POA: Diagnosis not present

## 2023-04-23 DIAGNOSIS — E1159 Type 2 diabetes mellitus with other circulatory complications: Secondary | ICD-10-CM | POA: Diagnosis not present

## 2023-04-23 DIAGNOSIS — Q2381 Bicuspid aortic valve: Secondary | ICD-10-CM | POA: Diagnosis not present

## 2023-04-23 DIAGNOSIS — I77819 Aortic ectasia, unspecified site: Secondary | ICD-10-CM | POA: Diagnosis not present

## 2023-04-28 DIAGNOSIS — I77819 Aortic ectasia, unspecified site: Secondary | ICD-10-CM | POA: Diagnosis not present

## 2023-06-02 ENCOUNTER — Encounter: Payer: Self-pay | Admitting: Family Medicine

## 2023-06-02 ENCOUNTER — Ambulatory Visit (INDEPENDENT_AMBULATORY_CARE_PROVIDER_SITE_OTHER): Payer: BC Managed Care – PPO | Admitting: Family Medicine

## 2023-06-02 VITALS — BP 108/72 | HR 85 | Ht 65.0 in | Wt 195.6 lb

## 2023-06-02 DIAGNOSIS — R9389 Abnormal findings on diagnostic imaging of other specified body structures: Secondary | ICD-10-CM

## 2023-06-02 NOTE — Progress Notes (Signed)
Date:  06/02/2023   Name:  Jeanette Flores   DOB:  1976/05/31   MRN:  [id number]   Chief Complaint: Follow-up (Patient here to discuss thyroid gland lesion. )  Thyroid Problem Presents for initial (abnormal chest ct finding) visit. Symptoms include cold intolerance, dry skin and fatigue. Patient reports no anxiety, constipation, depressed mood, diaphoresis, diarrhea, hair loss, heat intolerance, hoarse voice, leg swelling, menstrual problem, nail problem, palpitations, tremors, visual change, weight gain or weight loss. The symptoms have been stable.    Lab Results  Component Value Date   NA 136 12/10/2022   K 4.4 12/10/2022   CO2 26 12/10/2022   GLUCOSE 85 12/10/2022   BUN 17 12/10/2022   CREATININE 0.76 12/10/2022   CALCIUM 8.9 12/10/2022   EGFR 90 08/27/2022   GFRNONAA >60 12/10/2022   Lab Results  Component Value Date   CHOL 117 08/27/2022   HDL 38 (L) 08/27/2022   LDLCALC 66 08/27/2022   TRIG 61 08/27/2022   CHOLHDL 5.7 (H) 07/02/2017   Lab Results  Component Value Date   TSH 2.410 08/27/2022   Lab Results  Component Value Date   HGBA1C 5.6 09/19/2022   Lab Results  Component Value Date   WBC 5.9 12/10/2022   HGB 11.7 (L) 12/10/2022   HCT 37.5 12/10/2022   MCV 78.1 (L) 12/10/2022   PLT 345 12/10/2022   Lab Results  Component Value Date   ALT 15 12/10/2022   AST 16 12/10/2022   ALKPHOS 63 12/10/2022   BILITOT 0.6 12/10/2022   No results found for: "25OHVITD2", "25OHVITD3", "VD25OH"   Review of Systems  Constitutional:  Positive for fatigue. Negative for chills, diaphoresis, fever, unexpected weight change, weight gain and weight loss.  HENT:  Negative for hoarse voice and trouble swallowing.   Eyes:  Positive for visual disturbance.  Respiratory:  Negative for chest tightness, shortness of breath and wheezing.   Cardiovascular:  Negative for chest pain, palpitations and leg swelling.  Gastrointestinal:  Negative for abdominal pain, constipation  and diarrhea.  Endocrine: Positive for cold intolerance. Negative for heat intolerance.  Genitourinary:  Negative for menstrual problem.  Neurological:  Negative for tremors.  Psychiatric/Behavioral:  The patient is not nervous/anxious.     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   Menorrhagia 01/10/2019   B12 deficiency 01/10/2019   Morbid obesity (HCC) 07/02/2017   Hypertension associated with diabetes (HCC) 07/02/2017   Dyslipidemia 07/02/2017   Iron deficiency anemia 11/08/2015   Essential hypertension 11/02/2015   Type 2 diabetes mellitus without complication, without long-term current use of insulin (HCC) 11/02/2015   Hyperlipidemia 11/02/2015   Overweight 11/02/2015    Allergies  Allergen Reactions   Azithromycin Hives   Penicillins Hives    Past Surgical History:  Procedure Laterality Date   Gastric bypass surgery      Social History   Tobacco Use   Smoking status: Never   Smokeless tobacco: Never  Substance Use Topics   Alcohol use: No    Alcohol/week: 0.0 standard drinks of alcohol   Drug use: No     Medication list has been reviewed and updated.  Current Meds  Medication Sig   aspirin 81 MG tablet Take 1 tablet (81 mg total) by mouth daily.   atorvastatin (LIPITOR) 20 MG tablet Take 1 tablet (20 mg total) by mouth daily. Solum   ergocalciferol (VITAMIN D2) 1.25  MG (50000 UT) capsule TAKE 1 CAPSULE BY MOUTH 1 TIME A WEEK   ferrous sulfate 325 (65 FE) MG tablet Take 325 mg by mouth daily with breakfast. otc   fluticasone (FLONASE) 50 MCG/ACT nasal spray SHAKE LIQUID AND USE 2 SPRAYS IN EACH NOSTRIL DAILY   metFORMIN (GLUCOPHAGE XR) 500 MG 24 hr tablet Take 1 tablet (500 mg total) by mouth daily with breakfast. (Patient taking differently: Take 1,500 mg by mouth at bedtime. Dr Tedd Sias)   tirzepatide [facility]) 10 MG/0.5ML Pen Inject into the skin. endo   triamcinolone cream  (KENALOG) 0.1 % Apply 1 Application topically 2 (two) times daily.   valsartan (DIOVAN) 80 MG tablet Take 1 tablet (80 mg total) by mouth daily.       06/02/2023   10:49 AM 02/27/2023    9:01 AM 10/22/2022    8:51 AM 08/27/2022    8:43 AM  GAD 7 : Generalized Anxiety Score  Nervous, Anxious, on Edge 0 0 0 0  Control/stop worrying 0 0 0 0  Worry too much - different things 0 0 0 0  Trouble relaxing 0 0 0 0  Restless 0 0 0 0  Easily annoyed or irritable 0 0 0 0  Afraid - awful might happen 0 0 0 0  Total GAD 7 Score 0 0 0 0  Anxiety Difficulty Not difficult at all Not difficult at all Not difficult at all Not difficult at all       06/02/2023   10:48 AM 02/27/2023    9:00 AM 10/22/2022    8:51 AM  Depression screen PHQ 2/9  Decreased Interest 0 0 0  Down, Depressed, Hopeless 0 0 0  PHQ - 2 Score 0 0 0  Altered sleeping 0 0 0  Tired, decreased energy 1 0 0  Change in appetite 0 0 0  Feeling bad or failure about yourself  0 0 0  Trouble concentrating 0 0 0  Moving slowly or fidgety/restless 0 0 0  Suicidal thoughts 0 0 0  PHQ-9 Score 1 0 0  Difficult doing work/chores Not difficult at all Not difficult at all Not difficult at all    BP Readings from Last 3 Encounters:  06/02/23 108/72  02/27/23 102/74  12/19/22 99/70    Physical Exam Vitals and nursing note reviewed. Exam conducted with a chaperone present.  Constitutional:      General: She is not in acute distress.    Appearance: She is not diaphoretic.  HENT:     Head: Normocephalic and atraumatic.     Right Ear: Tympanic membrane and external ear normal.     Left Ear: Tympanic membrane and external ear normal.     Nose: Nose normal.     Mouth/Throat:     Mouth: Mucous membranes are moist.  Eyes:     General:        Right eye: No discharge.        Left eye: No discharge.     Conjunctiva/sclera: Conjunctivae normal.     Pupils: Pupils are equal, round, and reactive to light.  Neck:     Thyroid: No thyroid mass,  thyromegaly or thyroid tenderness.     Vascular: Normal carotid pulses. No carotid bruit, hepatojugular reflux or JVD.     Trachea: Trachea and phonation normal.  Cardiovascular:     Rate and Rhythm: Normal rate and regular rhythm.     Heart sounds: Normal heart sounds. No murmur heard.    No  friction rub. No gallop.  Pulmonary:     Effort: Pulmonary effort is normal.     Breath sounds: Normal breath sounds.  Abdominal:     General: Bowel sounds are normal.     Palpations: Abdomen is soft. There is no mass.     Tenderness: There is no abdominal tenderness. There is no guarding.  Musculoskeletal:        General: Normal range of motion.     Cervical back: Normal range of motion and neck supple.  Lymphadenopathy:     Cervical: No cervical adenopathy.     Right cervical: No superficial, deep or posterior cervical adenopathy.    Left cervical: No superficial, deep or posterior cervical adenopathy.  Skin:    General: Skin is warm and dry.  Neurological:     Mental Status: She is alert.     Wt Readings from Last 3 Encounters:  06/02/23 195 lb 9.6 oz (88.7 kg)  02/27/23 201 lb (91.2 kg)  12/12/22 206 lb 9.6 oz (93.7 kg)    BP 108/72   Pulse 85   Ht 5\' 5"  (1.651 m)   Wt 195 lb 9.6 oz (88.7 kg)   SpO2 98%   BMI 32.55 kg/m   Assessment and Plan: 1. Abnormal chest CT New onset.  During the course of a cardiac evaluation patient underwent a CT of the chest.  It was noted on the CT the patient had a normal appearance of the lymph node of 1 cm of the left axillary.  Was also noted of the right portion of the thyroid a 2.1 x 2.7 cm low attenuated area of concern.  I was unable to palpate any thyroid nodularity and not appreciate any asymmetry.  We will obtain an ultrasound of the thyroid to see if this is cystic or solid in nature and proceed with further evaluation thereupon.  In the meantime we will also obtain a thyroid panel with TSH. - US THYROID - Thyroid Panel With TSH      Elizabeth Sauer, MD

## 2023-06-03 ENCOUNTER — Encounter: Payer: Self-pay | Admitting: Family Medicine

## 2023-06-03 LAB — THYROID PANEL WITH TSH
Free Thyroxine Index: 2 (ref 1.2–4.9)
T3 Uptake Ratio: 27 % (ref 24–39)
T4, Total: 7.5 ug/dL (ref 4.5–12.0)
TSH: 2.42 u[IU]/mL (ref 0.450–4.500)

## 2023-06-05 ENCOUNTER — Ambulatory Visit
Admission: RE | Admit: 2023-06-05 | Discharge: 2023-06-05 | Disposition: A | Discharge status: Home / Self Care | Payer: BC Managed Care – PPO | Source: Ambulatory Visit | Attending: Family Medicine | Admitting: Family Medicine

## 2023-06-05 ENCOUNTER — Ambulatory Visit: Payer: BC Managed Care – PPO | Admitting: Family Medicine

## 2023-06-05 DIAGNOSIS — R9389 Abnormal findings on diagnostic imaging of other specified body structures: Secondary | ICD-10-CM | POA: Diagnosis not present

## 2023-06-05 DIAGNOSIS — E042 Nontoxic multinodular goiter: Secondary | ICD-10-CM | POA: Diagnosis not present

## 2023-06-09 ENCOUNTER — Inpatient Hospital Stay: Payer: BC Managed Care – PPO | Attending: Oncology

## 2023-06-09 DIAGNOSIS — Z9884 Bariatric surgery status: Secondary | ICD-10-CM | POA: Diagnosis not present

## 2023-06-09 DIAGNOSIS — D509 Iron deficiency anemia, unspecified: Secondary | ICD-10-CM | POA: Diagnosis not present

## 2023-06-09 DIAGNOSIS — E538 Deficiency of other specified B group vitamins: Secondary | ICD-10-CM | POA: Diagnosis not present

## 2023-06-09 DIAGNOSIS — D508 Other iron deficiency anemias: Secondary | ICD-10-CM

## 2023-06-09 LAB — IRON AND TIBC
Iron: 89 ug/dL (ref 28–170)
Saturation Ratios: 21 % (ref 10.4–31.8)
TIBC: 434 ug/dL (ref 250–450)
UIBC: 345 ug/dL

## 2023-06-09 LAB — CBC WITH DIFFERENTIAL (CANCER CENTER ONLY)
Abs Immature Granulocytes: 0.02 10*3/uL (ref 0.00–0.07)
Basophils Absolute: 0.1 10*3/uL (ref 0.0–0.1)
Basophils Relative: 1 %
Eosinophils Absolute: 0.3 10*3/uL (ref 0.0–0.5)
Eosinophils Relative: 5 %
HCT: 35.4 % — ABNORMAL LOW (ref 36.0–46.0)
Hemoglobin: 11.1 g/dL — ABNORMAL LOW (ref 12.0–15.0)
Immature Granulocytes: 0 %
Lymphocytes Relative: 27 %
Lymphs Abs: 1.3 10*3/uL (ref 0.7–4.0)
MCH: 24.3 pg — ABNORMAL LOW (ref 26.0–34.0)
MCHC: 31.4 g/dL (ref 30.0–36.0)
MCV: 77.6 fL — ABNORMAL LOW (ref 80.0–100.0)
Monocytes Absolute: 0.4 10*3/uL (ref 0.1–1.0)
Monocytes Relative: 9 %
Neutro Abs: 2.8 10*3/uL (ref 1.7–7.7)
Neutrophils Relative %: 58 %
Platelet Count: 293 10*3/uL (ref 150–400)
RBC: 4.56 MIL/uL (ref 3.87–5.11)
RDW: 16.3 % — ABNORMAL HIGH (ref 11.5–15.5)
WBC Count: 4.9 10*3/uL (ref 4.0–10.5)
nRBC: 0 % (ref 0.0–0.2)

## 2023-06-09 LAB — RETIC PANEL
Immature Retic Fract: 5.7 % (ref 2.3–15.9)
RBC.: 4.58 MIL/uL (ref 3.87–5.11)
Retic Count, Absolute: 41.2 10*3/uL (ref 19.0–186.0)
Retic Ct Pct: 0.9 % (ref 0.4–3.1)
Reticulocyte Hemoglobin: 27.5 pg — ABNORMAL LOW (ref >27.9)

## 2023-06-09 LAB — VITAMIN B12: Vitamin B-12: 1074 pg/mL — ABNORMAL HIGH (ref 180–914)

## 2023-06-09 LAB — FERRITIN: Ferritin: 14 ng/mL (ref 11–307)

## 2023-06-09 LAB — FOLATE: Folate: 10.2 ng/mL (ref >5.9)

## 2023-06-11 ENCOUNTER — Other Ambulatory Visit: Payer: Self-pay | Admitting: Family Medicine

## 2023-06-11 ENCOUNTER — Encounter: Payer: Self-pay | Admitting: Family Medicine

## 2023-06-11 DIAGNOSIS — E041 Nontoxic single thyroid nodule: Secondary | ICD-10-CM

## 2023-06-12 ENCOUNTER — Encounter: Payer: Self-pay | Admitting: Oncology

## 2023-06-12 ENCOUNTER — Inpatient Hospital Stay: Payer: BC Managed Care – PPO

## 2023-06-12 ENCOUNTER — Inpatient Hospital Stay: Payer: BC Managed Care – PPO | Admitting: Oncology

## 2023-06-12 VITALS — BP 113/79 | HR 77 | Temp 97.9°F | Wt 195.9 lb

## 2023-06-12 VITALS — BP 119/82 | HR 74

## 2023-06-12 DIAGNOSIS — N920 Excessive and frequent menstruation with regular cycle: Secondary | ICD-10-CM | POA: Diagnosis not present

## 2023-06-12 DIAGNOSIS — E538 Deficiency of other specified B group vitamins: Secondary | ICD-10-CM

## 2023-06-12 DIAGNOSIS — D509 Iron deficiency anemia, unspecified: Secondary | ICD-10-CM | POA: Diagnosis not present

## 2023-06-12 DIAGNOSIS — D508 Other iron deficiency anemias: Secondary | ICD-10-CM

## 2023-06-12 DIAGNOSIS — Z9884 Bariatric surgery status: Secondary | ICD-10-CM

## 2023-06-12 MED ORDER — SODIUM CHLORIDE 0.9% FLUSH
10.0000 mL | Freq: Once | INTRAVENOUS | Status: AC | PRN
  Administered 2023-06-12: 10 mL
  Filled 2023-06-12: qty 10

## 2023-06-12 MED ORDER — IRON SUCROSE 20 MG/ML IV SOLN
200.0000 mg | Freq: Once | INTRAVENOUS | Status: AC
Start: 2023-06-12 — End: 2023-06-12
  Administered 2023-06-12: 200 mg via INTRAVENOUS
  Filled 2023-06-12: qty 10

## 2023-06-12 NOTE — Progress Notes (Signed)
Pt here for follow up. Pt reports that she is having eye fatigue and has been followed by eye dr. Rock Nephew reports that she has thyroid lesion and was referred to ENT

## 2023-06-12 NOTE — Progress Notes (Signed)
Patient tolerated Venofer infusion well. Explained recommendation of 30 min post monitoring. Patient refused to wait post monitoring. Educated on what signs to watch for & to call with any concerns. No questions/symptoms, discharged. Stable

## 2023-06-12 NOTE — Assessment & Plan Note (Signed)
IV Venofer maintenance.  Continue monitor B12 and folate periodically.

## 2023-06-12 NOTE — Assessment & Plan Note (Signed)
Labs reviewed and discussed with patient Lab Results  Component Value Date   HGB 11.1 (L) 06/09/2023   TIBC 434 06/09/2023   IRONPCTSAT 21 06/09/2023   FERRITIN 14 06/09/2023     With her history of gastric bypass and heavy menstrual period, recommend Venofer weekly x 2. Recommend pregnancy testing prior to Venofer.  Patient declines.

## 2023-06-12 NOTE — Patient Instructions (Signed)
Iron Sucrose Injection What is this medication? IRON SUCROSE (EYE ern SOO krose) treats low levels of iron (iron deficiency anemia) in people with kidney disease. Iron is a mineral that plays an important role in making red blood cells, which carry oxygen from your lungs to the rest of your body. This medicine may be used for other purposes; ask your health care provider or pharmacist if you have questions. COMMON BRAND NAME(S): Venofer What should I tell my care team before I take this medication? They need to know if you have any of these conditions: Anemia not caused by low iron levels Heart disease High levels of iron in the blood Kidney disease Liver disease An unusual or allergic reaction to iron, other medications, foods, dyes, or preservatives Pregnant or trying to get pregnant Breastfeeding How should I use this medication? This medication is for infusion into a vein. It is given in a hospital or clinic setting. Talk to your care team about the use of this medication in children. While this medication may be prescribed for children as young as 2 years for selected conditions, precautions do apply. Overdosage: If you think you have taken too much of this medicine contact a poison control center or emergency room at once. NOTE: This medicine is only for you. Do not share this medicine with others. What if I miss a dose? Keep appointments for follow-up doses. It is important not to miss your dose. Call your care team if you are unable to keep an appointment. What may interact with this medication? Do not take this medication with any of the following: Deferoxamine Dimercaprol Other iron products This medication may also interact with the following: Chloramphenicol Deferasirox This list may not describe all possible interactions. Give your health care provider a list of all the medicines, herbs, non-prescription drugs, or dietary supplements you use. Also tell them if you smoke,  drink alcohol, or use illegal drugs. Some items may interact with your medicine. What should I watch for while using this medication? Visit your care team regularly. Tell your care team if your symptoms do not start to get better or if they get worse. You may need blood work done while you are taking this medication. You may need to follow a special diet. Talk to your care team. Foods that contain iron include: whole grains/cereals, dried fruits, beans, or peas, leafy green vegetables, and organ meats (liver, kidney). What side effects may I notice from receiving this medication? Side effects that you should report to your care team as soon as possible: Allergic reactions--skin rash, itching, hives, swelling of the face, lips, tongue, or throat Low blood pressure--dizziness, feeling faint or lightheaded, blurry vision Shortness of breath Side effects that usually do not require medical attention (report to your care team if they continue or are bothersome): Flushing Headache Joint pain Muscle pain Nausea Pain, redness, or irritation at injection site This list may not describe all possible side effects. Call your doctor for medical advice about side effects. You may report side effects to FDA at 1-800-FDA-1088. Where should I keep my medication? This medication is given in a hospital or clinic. It will not be stored at home. NOTE: This sheet is a summary. It may not cover all possible information. If you have questions about this medicine, talk to your doctor, pharmacist, or health care provider.  2024 Elsevier/Gold Standard (2022-11-15 00:00:00)

## 2023-06-12 NOTE — Progress Notes (Signed)
Hematology/Oncology Progress note [phone] [phone]     Duanne Limerick, MD  ASSESSMENT & PLAN:   Iron deficiency anemia Labs reviewed and discussed with patient Lab Results  Component Value Date   HGB 11.1 (L) 06/09/2023   TIBC 434 06/09/2023   IRONPCTSAT 21 06/09/2023   FERRITIN 14 06/09/2023     With her history of gastric bypass and heavy menstrual period, recommend Venofer weekly x 2. Recommend pregnancy testing prior to Venofer.  Patient declines.   B12 deficiency B12 level has improve  Decrease to every other day.   History of gastric bypass IV Venofer maintenance.  Continue monitor B12 and folate periodically.       Orders Placed This Encounter  Procedures   CBC with Differential (Cancer Center Only)    Standing Status:   Future    Expected Date:   06/18/2023    Expiration Date:   06/11/2024   Iron and TIBC    Standing Status:   Future    Expected Date:   06/18/2023    Expiration Date:   06/11/2024   Ferritin    Standing Status:   Future    Expected Date:   06/18/2023    Expiration Date:   06/11/2024   Follow-up in 6 months, labs prior to MD +/- Venofer. All questions were answered. The patient knows to call the clinic with any problems, questions or concerns. No barriers to learning was detected.  Rickard Patience, MD, PhD [facility] Hematology Oncology 06/12/2023    PERTINENT HEMATOLOGIC HISTORY: Patient previously followed up by Dr.Corcoran, patient switched care to me on 12/04/21 Patient has a history of gastric bypass surgery in 2008, chronic iron deficiency anemia, previously treated with multiple doses of IV Feraheme.  Recently due to change of insurance coverage, switched to IV Venofer treatments and the tolerated well  Patient continues to have menorrhagia.  Reports feeling tired and fatigued. She takes oral iron supplementation daily.  Denies any black or bloody stool. I have reviewed the past medical history, past  surgical history, social history and family history with the patient and they are unchanged from previous note.  INTERVAL HISTORY: Jeanette Flores 47 y.o. female presents for follow up of iron deficiency anemia, history of gastric bypass She reports feeling ok.  Menstrual period remains heavy.  ALLERGIES:  is allergic to azithromycin and penicillins.  MEDICATIONS:  Current Outpatient Medications  Medication Sig Dispense Refill   aspirin 81 MG tablet Take 1 tablet (81 mg total) by mouth daily. 30 tablet 11   atorvastatin (LIPITOR) 20 MG tablet Take 1 tablet (20 mg total) by mouth daily. Solum 90 tablet 1   ergocalciferol (VITAMIN D2) 1.25 MG (50000 UT) capsule TAKE 1 CAPSULE BY MOUTH 1 TIME A WEEK     ferrous sulfate 325 (65 FE) MG tablet Take 325 mg by mouth daily with breakfast. otc     fluticasone (FLONASE) 50 MCG/ACT nasal spray SHAKE LIQUID AND USE 2 SPRAYS IN EACH NOSTRIL DAILY 16 g 5   metFORMIN (GLUCOPHAGE XR) 500 MG 24 hr tablet Take 1 tablet (500 mg total) by mouth daily with breakfast. (Patient taking differently: Take 1,500 mg by mouth at bedtime. Dr Tedd Sias) 90 tablet 1   tirzepatide [facility]) 10 MG/0.5ML Pen Inject into the skin. endo     triamcinolone cream (KENALOG) 0.1 % Apply 1 Application topically 2 (two) times daily. 453.6 g 0   valsartan (DIOVAN) 80 MG tablet Take 1 tablet (80 mg total)  by mouth daily. 90 tablet 1   No current facility-administered medications for this visit.     Review of Systems  Constitutional:  Positive for fatigue. Negative for appetite change, chills and fever.  HENT:   Negative for hearing loss and voice change.   Eyes:  Negative for eye problems.  Respiratory:  Negative for chest tightness and cough.   Cardiovascular:  Negative for chest pain.  Gastrointestinal:  Negative for abdominal distention, abdominal pain and blood in stool.  Endocrine: Negative for hot flashes.  Genitourinary:  Positive for menstrual problem. Negative for  difficulty urinating and frequency.   Musculoskeletal:  Negative for arthralgias.  Skin:  Negative for itching and rash.  Neurological:  Negative for extremity weakness.  Hematological:  Negative for adenopathy.  Psychiatric/Behavioral:  Negative for confusion.      PHYSICAL EXAMINATION: ECOG PERFORMANCE STATUS: 1 - Symptomatic but completely ambulatory  Vitals:   06/12/23 1407  BP: 113/79  Pulse: 77  Temp: 97.9 F (36.6 C)   Filed Weights   06/12/23 1407  Weight: 195 lb 14.4 oz (88.9 kg)    Physical Exam Constitutional:      General: She is not in acute distress.    Appearance: She is obese. She is not diaphoretic.  HENT:     Head: Normocephalic and atraumatic.  Eyes:     General: No scleral icterus.    Pupils: Pupils are equal, round, and reactive to light.  Cardiovascular:     Rate and Rhythm: Normal rate and regular rhythm.     Heart sounds: Murmur heard.  Pulmonary:     Effort: Pulmonary effort is normal. No respiratory distress.     Breath sounds: No wheezing.  Abdominal:     General: There is no distension.  Musculoskeletal:        General: Normal range of motion.     Cervical back: Normal range of motion and neck supple.  Skin:    General: Skin is dry.     Findings: No erythema.  Neurological:     Mental Status: She is alert and oriented to person, place, and time. Mental status is at baseline.     Cranial Nerves: No cranial nerve deficit.     Motor: No abnormal muscle tone.  Psychiatric:        Mood and Affect: Mood and affect normal.      LABORATORY DATA:  I have reviewed the data as listed    Latest Ref Rng & Units 06/09/2023    8:11 AM 12/10/2022   10:49 AM 06/06/2022   11:39 AM  CBC  WBC 4.0 - 10.5 K/uL 4.9  5.9  3.6   Hemoglobin 12.0 - 15.0 g/dL 16.1  09.6  04.5   Hematocrit 36.0 - 46.0 % 35.4  37.5  38.3   Platelets 150 - 400 K/uL 293  345  309       Latest Ref Rng & Units 12/10/2022   10:49 AM 09/19/2022   12:00 AM 08/27/2022     9:24 AM  CMP  Glucose 70 - 99 mg/dL 85   83   BUN 6 - 20 mg/dL 17  25     17    Creatinine 0.44 - 1.00 mg/dL 4.09  1.1     8.11   Sodium 135 - 145 mmol/L 136  145     138   Potassium 3.5 - 5.1 mmol/L 4.4  5.1     4.5   Chloride 98 - 111 mmol/L  102  109     100   CO2 22 - 32 mmol/L 26  32     23   Calcium 8.9 - 10.3 mg/dL 8.9   9.7   Total Protein 6.5 - 8.1 g/dL 7.3     Total Bilirubin 0.3 - 1.2 mg/dL 0.6     Alkaline Phos 38 - 126 U/L 63  104       AST 15 - 41 U/L 16  39       ALT 0 - 44 U/L 15  38          This result is from an external source.   Iron/TIBC/Ferritin/ %Sat    Component Value Date/Time   IRON 89 06/09/2023 0811   IRON 129 08/23/2013 0827   TIBC 434 06/09/2023 0811   TIBC 432 08/23/2013 0827   FERRITIN 14 06/09/2023 0811   FERRITIN 25 08/23/2013 0827   IRONPCTSAT 21 06/09/2023 0811   IRONPCTSAT 30 08/23/2013 0827       RADIOGRAPHIC STUDIES: I have personally reviewed the radiological images as listed and agreed with the findings in the report. No results found.

## 2023-06-12 NOTE — Assessment & Plan Note (Addendum)
B12 level has improve  Decrease to every other day.

## 2023-06-16 ENCOUNTER — Inpatient Hospital Stay: Payer: BC Managed Care – PPO

## 2023-06-16 ENCOUNTER — Other Ambulatory Visit: Payer: Self-pay | Admitting: Oncology

## 2023-06-16 VITALS — BP 125/85 | HR 73 | Temp 97.9°F | Resp 18

## 2023-06-16 DIAGNOSIS — E538 Deficiency of other specified B group vitamins: Secondary | ICD-10-CM | POA: Diagnosis not present

## 2023-06-16 DIAGNOSIS — D509 Iron deficiency anemia, unspecified: Secondary | ICD-10-CM | POA: Diagnosis not present

## 2023-06-16 DIAGNOSIS — Z9884 Bariatric surgery status: Secondary | ICD-10-CM | POA: Diagnosis not present

## 2023-06-16 MED ORDER — SODIUM CHLORIDE 0.9% FLUSH
10.0000 mL | Freq: Once | INTRAVENOUS | Status: AC | PRN
Start: 2023-06-16 — End: 2023-06-16
  Administered 2023-06-16: 10 mL
  Filled 2023-06-16: qty 10

## 2023-06-16 MED ORDER — IRON SUCROSE 20 MG/ML IV SOLN
200.0000 mg | INTRAVENOUS | Status: DC
  Administered 2023-06-16: 200 mg via INTRAVENOUS
  Filled 2023-06-16: qty 10

## 2023-06-20 DIAGNOSIS — E042 Nontoxic multinodular goiter: Secondary | ICD-10-CM | POA: Diagnosis not present

## 2023-06-20 DIAGNOSIS — R1314 Dysphagia, pharyngoesophageal phase: Secondary | ICD-10-CM | POA: Diagnosis not present

## 2023-06-26 ENCOUNTER — Other Ambulatory Visit: Payer: Self-pay | Admitting: Otolaryngology

## 2023-06-26 DIAGNOSIS — E041 Nontoxic single thyroid nodule: Secondary | ICD-10-CM

## 2023-06-30 NOTE — Progress Notes (Signed)
 Patient for US  guided FNA RT Mid Thyroid  Nodule Biopsy on Tues 07/01/2023, I called and spoke with the patient on the phone and gave pre-procedure instructions. Pt was made aware to be here at 2p and check in at the [facility] Registration desk. Pt stated understanding.  Called 06/30/2023

## 2023-07-01 ENCOUNTER — Ambulatory Visit
Admission: RE | Admit: 2023-07-01 | Discharge: 2023-07-01 | Disposition: A | Discharge status: Home / Self Care | Payer: BC Managed Care – PPO | Source: Ambulatory Visit | Attending: Otolaryngology | Admitting: Otolaryngology

## 2023-07-01 ENCOUNTER — Other Ambulatory Visit (HOSPITAL_COMMUNITY): Payer: Self-pay | Admitting: Interventional Radiology

## 2023-07-01 DIAGNOSIS — E041 Nontoxic single thyroid nodule: Secondary | ICD-10-CM | POA: Diagnosis not present

## 2023-07-01 DIAGNOSIS — R041 Hemorrhage from throat: Secondary | ICD-10-CM | POA: Insufficient documentation

## 2023-07-01 MED ORDER — LIDOCAINE HCL (PF) 1 % IJ SOLN
10.0000 mL | Freq: Once | INTRAMUSCULAR | Status: AC
  Administered 2023-07-01: 10 mL via INTRADERMAL
  Filled 2023-07-01: qty 10

## 2023-07-01 NOTE — Procedures (Signed)
 Interventional Radiology Procedure Note  Procedure: rt thyroid nodule bx    Complications: None  Estimated Blood Loss:  min  Findings: FNA X 5 OF THE 3.3CM RMP TR3 NODULE(#2)    M. Ruel Favors, MD

## 2023-07-02 LAB — CYTOLOGY - NON PAP

## 2023-07-21 ENCOUNTER — Ambulatory Visit: Payer: BC Managed Care – PPO | Admitting: Family Medicine

## 2023-07-21 VITALS — BP 103/72 | HR 88 | Temp 98.1°F | Resp 16 | Ht 64.0 in | Wt 189.0 lb

## 2023-07-21 DIAGNOSIS — D508 Other iron deficiency anemias: Secondary | ICD-10-CM

## 2023-07-21 DIAGNOSIS — I10 Essential (primary) hypertension: Secondary | ICD-10-CM | POA: Diagnosis not present

## 2023-07-21 DIAGNOSIS — Z7985 Long-term (current) use of injectable non-insulin antidiabetic drugs: Secondary | ICD-10-CM

## 2023-07-21 DIAGNOSIS — E119 Type 2 diabetes mellitus without complications: Secondary | ICD-10-CM

## 2023-07-21 DIAGNOSIS — E782 Mixed hyperlipidemia: Secondary | ICD-10-CM | POA: Diagnosis not present

## 2023-07-21 DIAGNOSIS — Z23 Encounter for immunization: Secondary | ICD-10-CM

## 2023-07-21 DIAGNOSIS — Z9884 Bariatric surgery status: Secondary | ICD-10-CM

## 2023-07-21 DIAGNOSIS — Z7689 Persons encountering health services in other specified circumstances: Secondary | ICD-10-CM

## 2023-07-21 DIAGNOSIS — Z7984 Long term (current) use of oral hypoglycemic drugs: Secondary | ICD-10-CM

## 2023-07-21 LAB — POCT GLYCOSYLATED HEMOGLOBIN (HGB A1C): Hemoglobin A1C: 5.4 % (ref 4.0–5.6)

## 2023-07-21 NOTE — Progress Notes (Unsigned)
Patient is here to established care with provider. ~health hx address ~care gaps address   Pneumonia Vaccinations     Name Date   Pneumococcal PPV 07/02/2017 10:57 AM (48 y.o.), 07/21/2023  8:24 AM (47 y.o.)      Flu given

## 2023-07-22 ENCOUNTER — Encounter: Payer: Self-pay | Admitting: Family Medicine

## 2023-07-22 NOTE — Progress Notes (Signed)
New Patient Office Visit  Subjective    Patient ID: Jeanette Flores, female    DOB: 05-08-1976  Age: 48 y.o. MRN: [id number]  CC:  Chief Complaint  Patient presents with   Establish Care    HPI NUHA DEGNER presents to establish care and for review of chronic med issues including diabetes and hypertension. She also has had a gastric bypass in the past and attributes her iron deficiency anemia to that. She continues to take metformin and monjauro.She denies any acute complaints at this time.    Outpatient Encounter Medications as of 07/21/2023  Medication Sig   aspirin 81 MG tablet Take 1 tablet (81 mg total) by mouth daily.   atorvastatin (LIPITOR) 20 MG tablet Take 1 tablet (20 mg total) by mouth daily. Solum   ergocalciferol (VITAMIN D2) 1.25 MG (50000 UT) capsule TAKE 1 CAPSULE BY MOUTH 1 TIME A WEEK   ferrous sulfate 325 (65 FE) MG tablet Take 325 mg by mouth daily with breakfast. otc   fluticasone (FLONASE) 50 MCG/ACT nasal spray SHAKE LIQUID AND USE 2 SPRAYS IN EACH NOSTRIL DAILY   metFORMIN (GLUCOPHAGE XR) 500 MG 24 hr tablet Take 1 tablet (500 mg total) by mouth daily with breakfast. (Patient taking differently: Take 1,500 mg by mouth at bedtime. Dr Tedd Sias)   tirzepatide [facility]) 10 MG/0.5ML Pen Inject into the skin. endo   triamcinolone cream (KENALOG) 0.1 % Apply 1 Application topically 2 (two) times daily.   valsartan (DIOVAN) 80 MG tablet Take 1 tablet (80 mg total) by mouth daily.   No facility-administered encounter medications on file as of 07/21/2023.    Past Medical History:  Diagnosis Date   Anemia    Hypertension     Past Surgical History:  Procedure Laterality Date   Gastric bypass surgery      Family History  Problem Relation Age of Onset   Diabetes Father    Breast cancer Maternal Aunt    Diabetes Paternal Grandmother     Social History   Socioeconomic History   Marital status: Married    Spouse name: Not on file   Number of children:  Not on file   Years of education: Not on file   Highest education level: Associate degree: academic program  Occupational History   Not on file  Tobacco Use   Smoking status: Never   Smokeless tobacco: Never  Substance and Sexual Activity   Alcohol use: No    Alcohol/week: 0.0 standard drinks of alcohol   Drug use: No   Sexual activity: Yes  Other Topics Concern   Not on file  Social History Narrative   Not on file   Social Drivers of Health   Financial Resource Strain: Low Risk  (07/20/2023)   Overall Financial Resource Strain (CARDIA)    Difficulty of Paying Living Expenses: Not very hard  Recent Concern: Financial Resource Strain - Medium Risk (05/30/2023)   Overall Financial Resource Strain (CARDIA)    Difficulty of Paying Living Expenses: Somewhat hard  Food Insecurity: No Food Insecurity (07/20/2023)   Hunger Vital Sign    Worried About Running Out of Food in the Last Year: Never true    Ran Out of Food in the Last Year: Never true  Transportation Needs: No Transportation Needs (07/20/2023)   PRAPARE - Administrator, Civil Service (Medical): No    Lack of Transportation (Non-Medical): No  Physical Activity: Insufficiently Active (07/20/2023)   Exercise Vital Sign  Days of Exercise per Week: 2 days    Minutes of Exercise per Session: 20 min  Stress: No Stress Concern Present (07/20/2023)   [employer] of Occupational Health - Occupational Stress Questionnaire    Feeling of Stress : Not at all  Social Connections: Socially Integrated (07/20/2023)   Social Connection and Isolation Panel [NHANES]    Frequency of Communication with Friends and Family: More than three times a week    Frequency of Social Gatherings with Friends and Family: Twice a week    Attends Religious Services: More than 4 times per year    Active Member of [employer] or Organizations: Yes    Attends Engineer, structural: More than 4 times per year    Marital Status: Married   Catering manager Violence: Not on file    Review of Systems  All other systems reviewed and are negative.       Objective   BP 103/72   Pulse 88   Temp 98.1 F (36.7 C) (Oral)   Resp 16   Ht 5\' 4"  (1.626 m)   Wt 189 lb (85.7 kg)   SpO2 100%   BMI 32.44 kg/m   Physical Exam Vitals and nursing note reviewed.  Constitutional:      General: She is not in acute distress. Cardiovascular:     Rate and Rhythm: Normal rate and regular rhythm.  Pulmonary:     Effort: Pulmonary effort is normal.     Breath sounds: Normal breath sounds.  Abdominal:     Palpations: Abdomen is soft.     Tenderness: There is no abdominal tenderness.  Neurological:     General: No focal deficit present.     Mental Status: She is alert and oriented to person, place, and time.         Assessment & Plan:  1. Type 2 diabetes mellitus without complication, without long-term current use of insulin (HCC) (Primary) Appears stable. A1c at goal. Continue metformin and monjauro - POCT glycosylated hemoglobin (Hb A1C)  2. Essential hypertension Appears stable.   3. Immunization due  - Pneumococcal polysaccharide vaccine 23-valent greater than or equal to 2yo subcutaneous/IM  4. Encounter for immunization  - Flu vaccine trivalent PF, 6mos and older(Flulaval,Afluria,Fluarix,Fluzone)  5. History of gastric bypass   6. Mixed hyperlipidemia continue  7. Other iron deficiency anemia   8. Diabetes mellitus treated with oral medication (HCC)   9. Encounter to establish care   10. Long-term current use of injectable noninsulin antidiabetic medication     Return in about 6 months (around 01/18/2024).   Tommie Raymond, MD

## 2023-08-15 ENCOUNTER — Ambulatory Visit: Payer: Self-pay | Admitting: Family Medicine

## 2023-08-18 ENCOUNTER — Encounter: Payer: Self-pay | Admitting: Family Medicine

## 2023-08-18 ENCOUNTER — Ambulatory Visit: Payer: BC Managed Care – PPO | Admitting: Family Medicine

## 2023-08-18 VITALS — BP 118/76 | HR 89 | Ht 64.0 in | Wt 187.0 lb

## 2023-08-18 DIAGNOSIS — Z7984 Long term (current) use of oral hypoglycemic drugs: Secondary | ICD-10-CM

## 2023-08-18 DIAGNOSIS — E785 Hyperlipidemia, unspecified: Secondary | ICD-10-CM

## 2023-08-18 DIAGNOSIS — L2082 Flexural eczema: Secondary | ICD-10-CM | POA: Diagnosis not present

## 2023-08-18 DIAGNOSIS — E1159 Type 2 diabetes mellitus with other circulatory complications: Secondary | ICD-10-CM

## 2023-08-18 DIAGNOSIS — J301 Allergic rhinitis due to pollen: Secondary | ICD-10-CM

## 2023-08-18 DIAGNOSIS — I152 Hypertension secondary to endocrine disorders: Secondary | ICD-10-CM

## 2023-08-18 MED ORDER — TRIAMCINOLONE ACETONIDE 0.1 % EX CREA: 1.0000 | TOPICAL_CREAM | Freq: Two times a day (BID) | CUTANEOUS | 0 refills | Status: AC

## 2023-08-18 MED ORDER — ATORVASTATIN CALCIUM 20 MG PO TABS: 20.0000 mg | ORAL_TABLET | Freq: Every day | ORAL | 1 refills | Status: DC

## 2023-08-18 MED ORDER — VALSARTAN 80 MG PO TABS: 80.0000 mg | ORAL_TABLET | Freq: Every day | ORAL | 1 refills | Status: DC

## 2023-08-18 MED ORDER — FLUTICASONE PROPIONATE 50 MCG/ACT NA SUSP: NASAL | 5 refills | Status: AC

## 2023-08-18 NOTE — Progress Notes (Signed)
 Date:  08/18/2023   Name:  Jeanette Flores   DOB:  1975-11-26   MRN:  [id number]   Chief Complaint: Medical Management of Chronic Issues (Patient presents today for a follow up on her HTN and HLD she has been taking her medication as directed and has no concerns for today's visit.)  Hyperlipidemia This is a chronic problem. The current episode started more than 1 year ago. The problem is controlled. Exacerbating diseases include diabetes. Pertinent negatives include no chest pain, focal sensory loss, focal weakness, leg pain, myalgias or shortness of breath. She is currently on no antihyperlipidemic treatment. The current treatment provides moderate improvement of lipids. There are no compliance problems.   Hypertension This is a chronic problem. The current episode started more than 1 year ago. The problem has been gradually improving since onset. The problem is controlled. Pertinent negatives include no anxiety, blurred vision, chest pain, headaches, malaise/fatigue, neck pain, orthopnea, palpitations, peripheral edema, PND, shortness of breath or sweats. There are no associated agents to hypertension. Past treatments include angiotensin blockers. The current treatment provides moderate improvement. There is no history of CAD/MI or CVA. tia/2008. Identifiable causes of hypertension include a thyroid problem.  URI  This is a recurrent problem. The current episode started more than 1 year ago. Associated symptoms include a rash and rhinorrhea. Pertinent negatives include no abdominal pain, chest pain, congestion, coughing, dysuria, headaches, joint swelling, nausea, neck pain, sinus pain, sore throat, vomiting or wheezing. Associated symptoms comments: Seasonal rhinorrhea.    Lab Results  Component Value Date   NA 136 12/10/2022   K 4.4 12/10/2022   CO2 26 12/10/2022   GLUCOSE 85 12/10/2022   BUN 17 12/10/2022   CREATININE 0.76 12/10/2022   CALCIUM 8.9 12/10/2022   EGFR 90 08/27/2022    GFRNONAA >60 12/10/2022   Lab Results  Component Value Date   CHOL 117 08/27/2022   HDL 38 (L) 08/27/2022   LDLCALC 66 08/27/2022   TRIG 61 08/27/2022   CHOLHDL 5.7 (H) 07/02/2017   Lab Results  Component Value Date   TSH 2.420 06/02/2023   Lab Results  Component Value Date   HGBA1C 5.4 07/21/2023   Lab Results  Component Value Date   WBC 4.9 06/09/2023   HGB 11.1 (L) 06/09/2023   HCT 35.4 (L) 06/09/2023   MCV 77.6 (L) 06/09/2023   PLT 293 06/09/2023   Lab Results  Component Value Date   ALT 15 12/10/2022   AST 16 12/10/2022   ALKPHOS 63 12/10/2022   BILITOT 0.6 12/10/2022   No results found for: "25OHVITD2", "25OHVITD3", "VD25OH"   Review of Systems  Constitutional:  Negative for malaise/fatigue.  HENT:  Positive for rhinorrhea. Negative for congestion, postnasal drip, sinus pain and sore throat.   Eyes:  Negative for blurred vision and visual disturbance.  Respiratory:  Negative for apnea, cough, choking, chest tightness, shortness of breath and wheezing.   Cardiovascular:  Negative for chest pain, palpitations, orthopnea and PND.  Gastrointestinal:  Negative for abdominal pain, nausea and vomiting.  Endocrine: Negative for polydipsia and polyuria.  Genitourinary:  Negative for dysuria.  Musculoskeletal:  Negative for myalgias and neck pain.  Skin:  Positive for rash.  Neurological:  Negative for focal weakness and headaches.    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   Menorrhagia 01/10/2019   B12 deficiency 01/10/2019   Morbid obesity (HCC) 07/02/2017  Hypertension associated with diabetes (HCC) 07/02/2017   Dyslipidemia 07/02/2017   Iron deficiency anemia 11/08/2015   Essential hypertension 11/02/2015   Type 2 diabetes mellitus without complication, without long-term current use of insulin (HCC) 11/02/2015   Hyperlipidemia 11/02/2015   Overweight 11/02/2015    Allergies   Allergen Reactions   Azithromycin Hives   Penicillins Hives    Past Surgical History:  Procedure Laterality Date   Gastric bypass surgery      Social History   Tobacco Use   Smoking status: Never   Smokeless tobacco: Never  Substance Use Topics   Alcohol use: No    Alcohol/week: 0.0 standard drinks of alcohol   Drug use: No     Medication list has been reviewed and updated.  Current Meds  Medication Sig   aspirin 81 MG tablet Take 1 tablet (81 mg total) by mouth daily.   atorvastatin (LIPITOR) 20 MG tablet Take 1 tablet (20 mg total) by mouth daily. Solum   ferrous sulfate 325 (65 FE) MG tablet Take 325 mg by mouth daily with breakfast. otc   metFORMIN (GLUCOPHAGE XR) 500 MG 24 hr tablet Take 1 tablet (500 mg total) by mouth daily with breakfast. (Patient taking differently: Take 1,500 mg by mouth at bedtime. Dr Tedd Sias)   tirzepatide [facility]) 12.5 MG/0.5ML Pen Inject 12.5 mg into the skin once a week.   valsartan (DIOVAN) 80 MG tablet Take 1 tablet (80 mg total) by mouth daily.       08/18/2023    9:05 AM 07/21/2023    8:09 AM 06/02/2023   10:49 AM 02/27/2023    9:01 AM  GAD 7 : Generalized Anxiety Score  Nervous, Anxious, on Edge 0 0 0 0  Control/stop worrying 0 0 0 0  Worry too much - different things 0 0 0 0  Trouble relaxing 0 0 0 0  Restless 0 0 0 0  Easily annoyed or irritable 0 0 0 0  Afraid - awful might happen 0  0 0  Total GAD 7 Score 0  0 0  Anxiety Difficulty Not difficult at all Not difficult at all Not difficult at all Not difficult at all       08/18/2023    9:04 AM 07/21/2023    8:09 AM 06/02/2023   10:48 AM  Depression screen PHQ 2/9  Decreased Interest 0 0 0  Down, Depressed, Hopeless 0 0 0  PHQ - 2 Score 0 0 0  Altered sleeping 0 0 0  Tired, decreased energy 0 0 1  Change in appetite 0 0 0  Feeling bad or failure about yourself  0 0 0  Trouble concentrating 0 0 0  Moving slowly or fidgety/restless 0 0 0  Suicidal thoughts 0 0 0  PHQ-9  Score 0 0 1  Difficult doing work/chores Not difficult at all Not difficult at all Not difficult at all    BP Readings from Last 3 Encounters:  08/18/23 118/76  07/21/23 103/72  07/01/23 110/75    Physical Exam Vitals and nursing note reviewed.  Constitutional:      General: She is not in acute distress.    Appearance: She is not diaphoretic.  HENT:     Head: Normocephalic and atraumatic.     Right Ear: External ear normal.     Left Ear: External ear normal.     Nose: Nose normal.     Mouth/Throat:     Mouth: Mucous membranes are moist.  Eyes:  General:        Right eye: No discharge.        Left eye: No discharge.     Conjunctiva/sclera: Conjunctivae normal.     Pupils: Pupils are equal, round, and reactive to light.  Neck:     Thyroid: No thyromegaly.     Vascular: No JVD.  Cardiovascular:     Rate and Rhythm: Normal rate and regular rhythm.     Heart sounds: Normal heart sounds. No murmur heard.    No friction rub. No gallop.  Pulmonary:     Effort: Pulmonary effort is normal.     Breath sounds: Normal breath sounds. No wheezing, rhonchi or rales.  Abdominal:     General: Bowel sounds are normal.     Palpations: Abdomen is soft. There is no hepatomegaly, splenomegaly or mass.     Tenderness: There is no abdominal tenderness. There is no guarding or rebound.  Musculoskeletal:        General: Normal range of motion.     Cervical back: Normal range of motion and neck supple.  Lymphadenopathy:     Cervical: No cervical adenopathy.  Skin:    General: Skin is warm and dry.  Neurological:     Mental Status: She is alert.     Deep Tendon Reflexes: Reflexes are normal and symmetric.     Wt Readings from Last 3 Encounters:  08/18/23 187 lb (84.8 kg)  07/21/23 189 lb (85.7 kg)  06/12/23 195 lb 14.4 oz (88.9 kg)    BP 118/76   Pulse 89   Ht 5\' 4"  (1.626 m)   Wt 187 lb (84.8 kg)   SpO2 98%   BMI 32.10 kg/m   Assessment and Plan: 1. Hypertension  associated with diabetes (HCC) (Primary) Chronic.  Controlled.  Stable.  Blood pressure today is 118/76.  Asymptomatic.  Tolerating medication well.  Continue valsartan 80 mg once a day.  Will recheck in 6 months.  Review of CMP for electrolytes and GFR upcoming. - valsartan (DIOVAN) 80 MG tablet; Take 1 tablet (80 mg total) by mouth daily.  Dispense: 90 tablet; Refill: 1  2. Seasonal allergic rhinitis due to pollen Chronic.  Episodic.  Seasonal.  Patient has control of her symptomatology of allergic rhinitis with Flonase 2 sprays each nostril daily. - fluticasone (FLONASE) 50 MCG/ACT nasal spray; SHAKE LIQUID AND USE 2 SPRAYS IN EACH NOSTRIL DAILY  Dispense: 16 g; Refill: 5  3. Flexural eczema Chronic.  Episodic.  Seasonal flares primarily in the flexural areas.  Patient will refill her triamcinolone 0.1% and apply on an as-needed basis. - triamcinolone cream (KENALOG) 0.1 %; Apply 1 Application topically 2 (two) times daily.  Dispense: 453.6 g; Refill: 0  4. Dyslipidemia Chronic.  Controlled.  Stable.  Asymptomatic.  Tolerating current dosing of atorvastatin 20 mg once a day.  Will check CMP for hepatic concerns.  Will check lipid panel for current level of LDL control as well.  Will recheck patient in 6 months - atorvastatin (LIPITOR) 20 MG tablet; Take 1 tablet (20 mg total) by mouth daily. Solum  Dispense: 90 tablet; Refill: 1 - Comprehensive metabolic panel - Lipid Panel With LDL/HDL Ratio     Elizabeth Sauer, MD

## 2023-08-19 ENCOUNTER — Encounter: Payer: Self-pay | Admitting: Family Medicine

## 2023-08-19 LAB — LIPID PANEL WITH LDL/HDL RATIO
Cholesterol, Total: 118 mg/dL (ref 100–199)
HDL: 50 mg/dL (ref >39)
LDL Chol Calc (NIH): 56 mg/dL (ref 0–99)
LDL/HDL Ratio: 1.1 {ratio} (ref 0.0–3.2)
Triglycerides: 50 mg/dL (ref 0–149)
VLDL Cholesterol Cal: 12 mg/dL (ref 5–40)

## 2023-08-19 LAB — COMPREHENSIVE METABOLIC PANEL
ALT: 17 [IU]/L (ref 0–32)
AST: 18 [IU]/L (ref 0–40)
Albumin: 4.1 g/dL (ref 3.9–4.9)
Alkaline Phosphatase: 70 [IU]/L (ref 44–121)
BUN/Creatinine Ratio: 17 (ref 9–23)
BUN: 15 mg/dL (ref 6–24)
Bilirubin Total: 0.4 mg/dL (ref 0.0–1.2)
CO2: 23 mmol/L (ref 20–29)
Calcium: 9.5 mg/dL (ref 8.7–10.2)
Chloride: 99 mmol/L (ref 96–106)
Creatinine, Ser: 0.9 mg/dL (ref 0.57–1.00)
Globulin, Total: 2.6 g/dL (ref 1.5–4.5)
Glucose: 77 mg/dL (ref 70–99)
Potassium: 4.9 mmol/L (ref 3.5–5.2)
Sodium: 139 mmol/L (ref 134–144)
Total Protein: 6.7 g/dL (ref 6.0–8.5)
eGFR: 80 mL/min/{1.73_m2} (ref >59)

## 2023-09-05 DIAGNOSIS — Z1231 Encounter for screening mammogram for malignant neoplasm of breast: Secondary | ICD-10-CM | POA: Diagnosis not present

## 2023-09-05 DIAGNOSIS — N6310 Unspecified lump in the right breast, unspecified quadrant: Secondary | ICD-10-CM | POA: Diagnosis not present

## 2023-09-05 DIAGNOSIS — N6320 Unspecified lump in the left breast, unspecified quadrant: Secondary | ICD-10-CM | POA: Diagnosis not present

## 2023-09-05 LAB — HM MAMMOGRAPHY

## 2023-09-09 DIAGNOSIS — Z01419 Encounter for gynecological examination (general) (routine) without abnormal findings: Secondary | ICD-10-CM | POA: Diagnosis not present

## 2023-09-09 IMAGING — MG MM DIGITAL SCREENING BILAT W/ TOMO AND CAD
6 of 12 series · 6 of 36 positions shown · non-contrast
Comparison: Previous exam(s).

CLINICAL DATA: Screening.

EXAM:
DIGITAL SCREENING BILATERAL MAMMOGRAM WITH TOMOSYNTHESIS AND CAD
TECHNIQUE: Bilateral screening digital craniocaudal and mediolateral oblique
mammograms were obtained. Bilateral screening digital breast
tomosynthesis was performed. The images were evaluated with
computer-aided detection.

[R MLO synth-2D (1 of 2)]
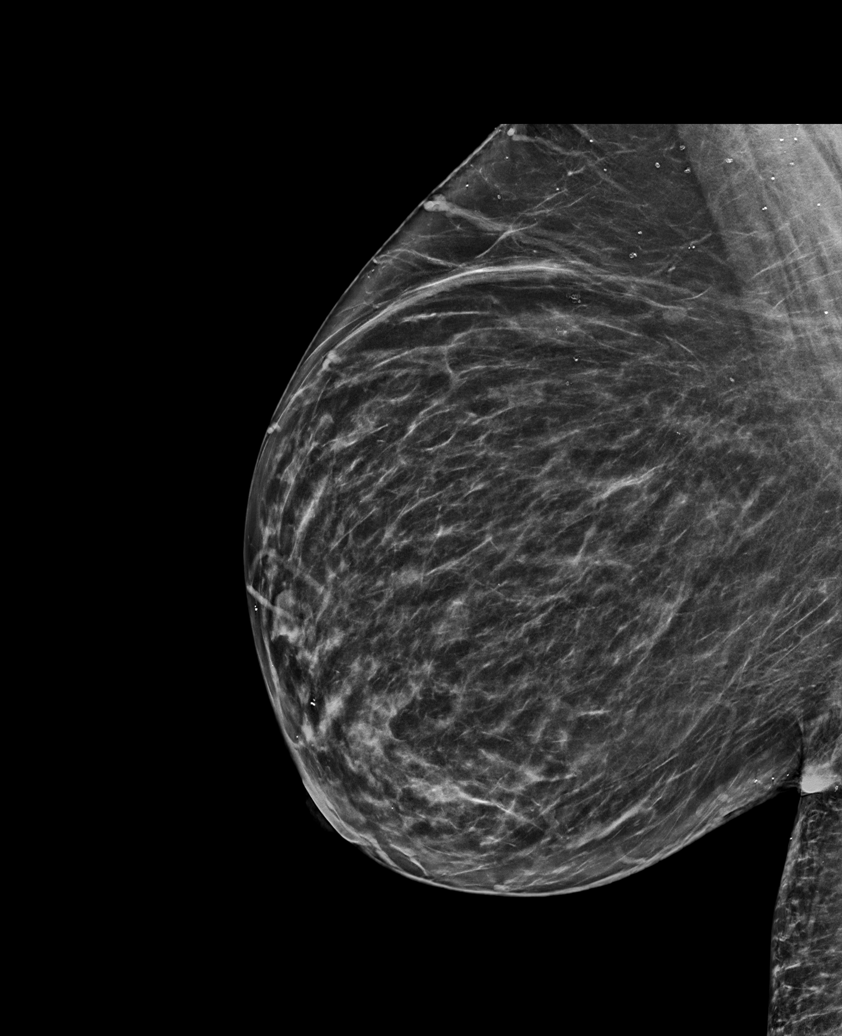

[L MLO synth-2D (1 of 2)]
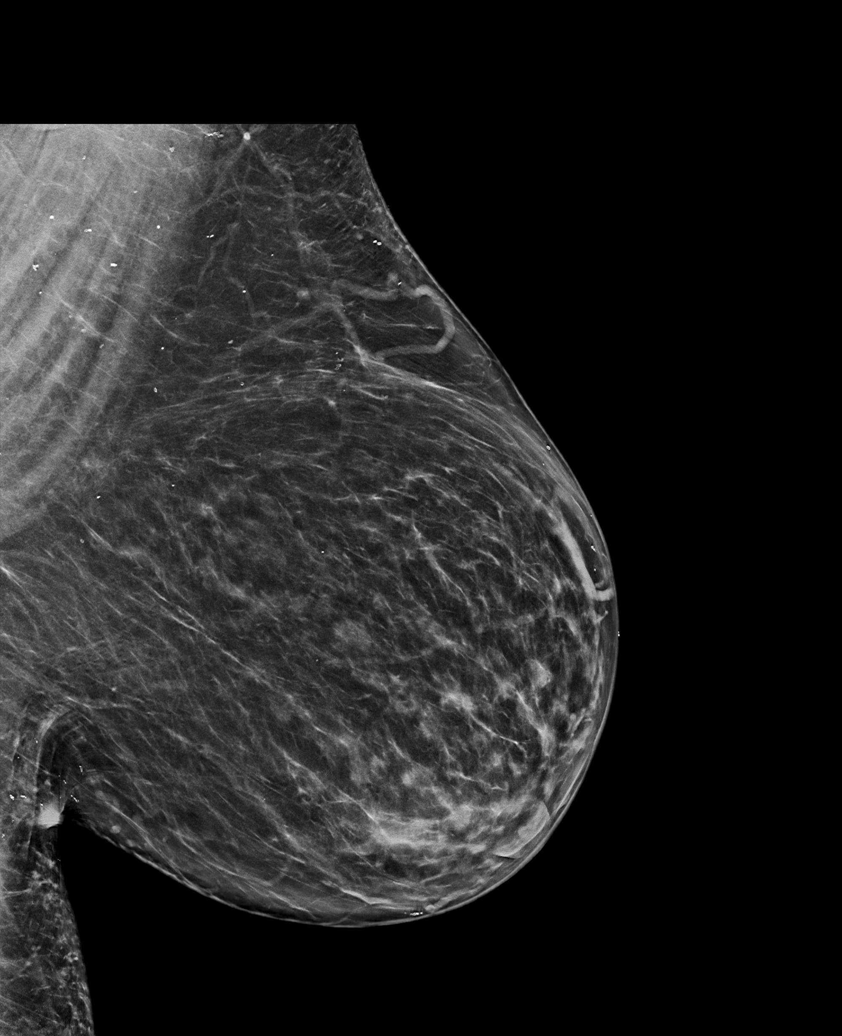

[L MLO synth-2D (2 of 2)]
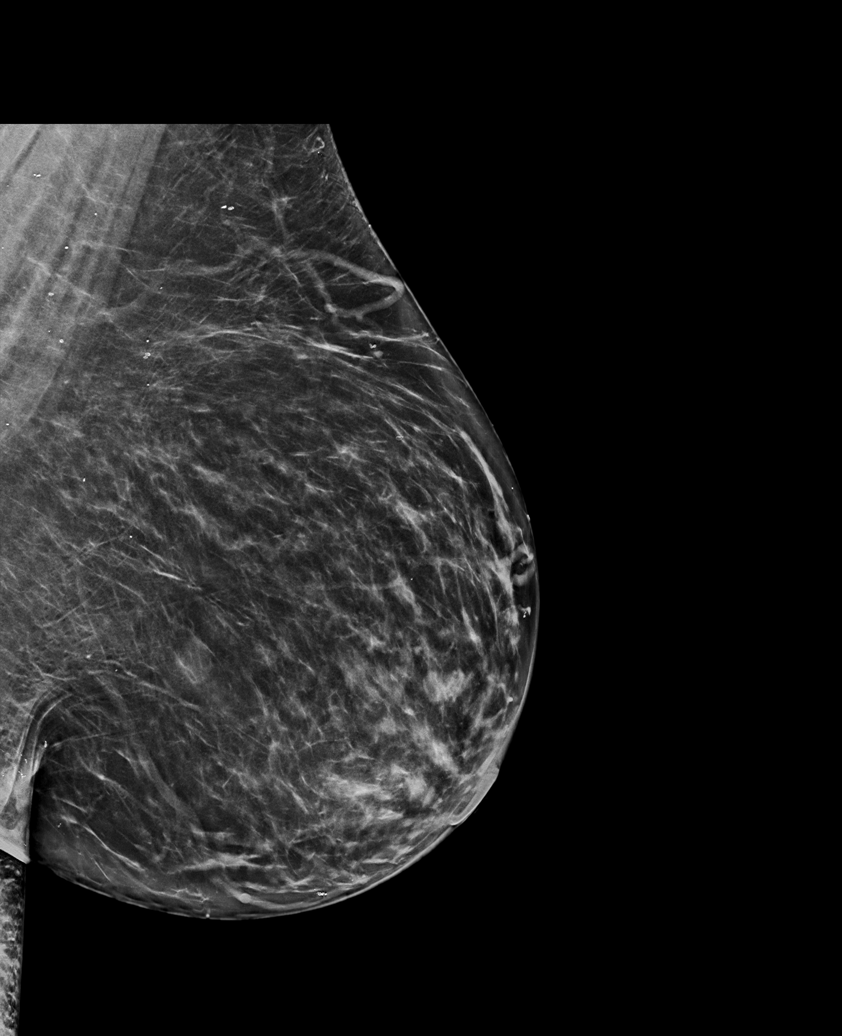

[R MLO synth-2D (2 of 2)]
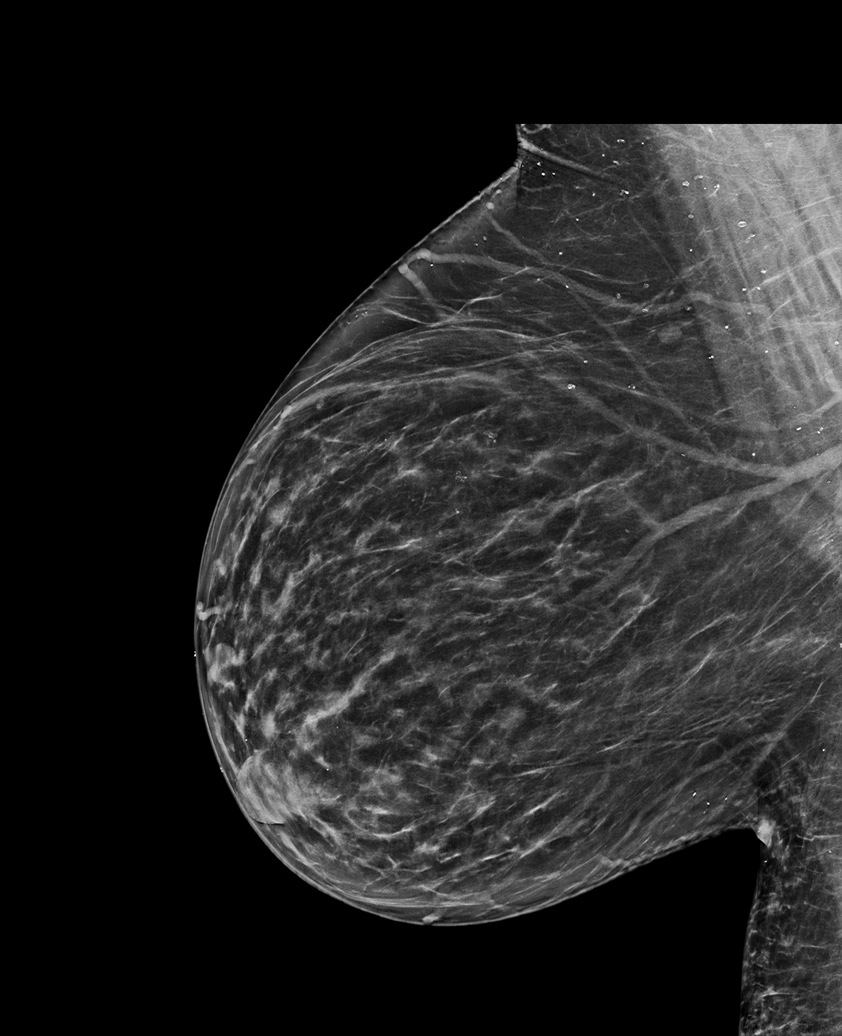

[R CC synth-2D]
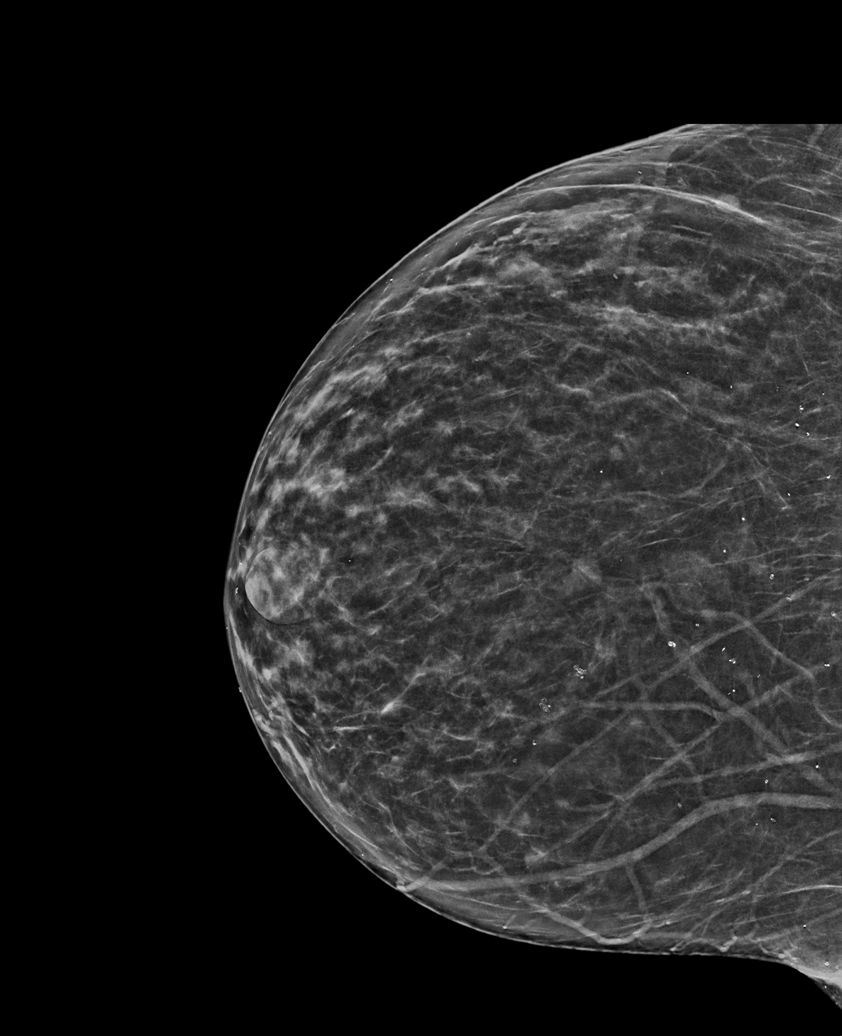

[L CC synth-2D]
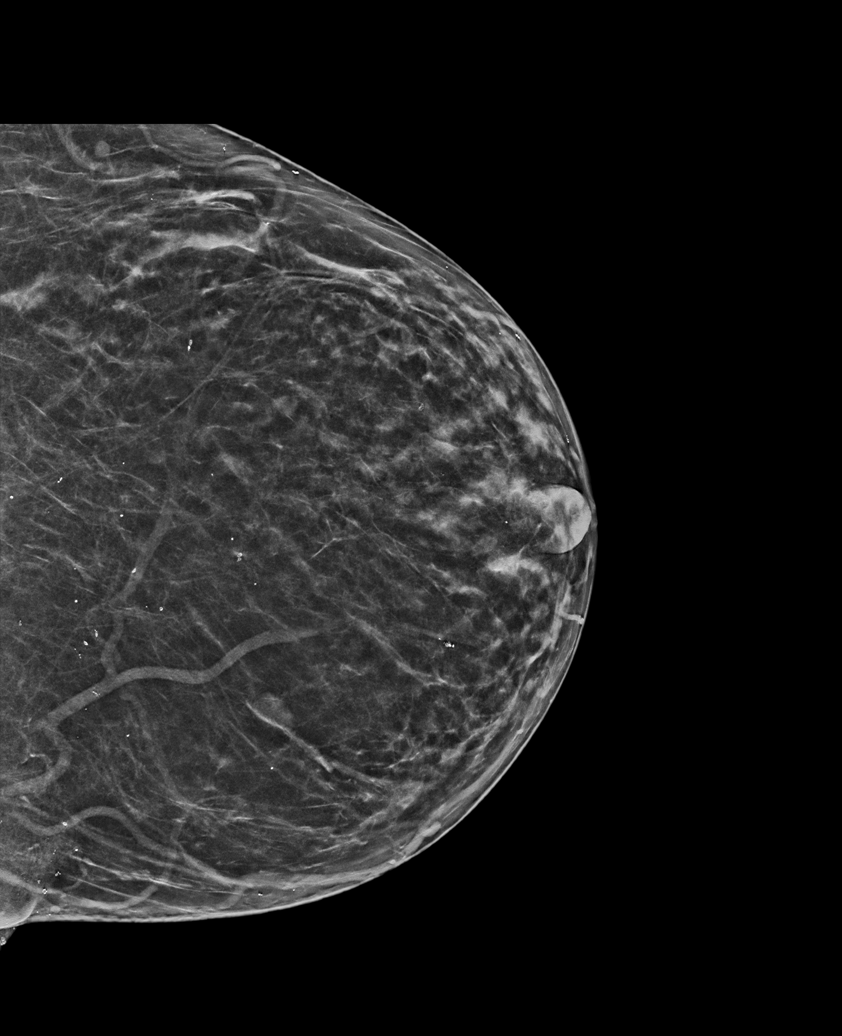

[6 of 36 positions shown; findings below may reference images not displayed]

ACR Breast Density Category b: There are scattered areas of
fibroglandular density.
FINDINGS: There are no findings suspicious for malignancy.
IMPRESSION: No mammographic evidence of malignancy. A result letter of this
screening mammogram will be mailed directly to the patient.

RECOMMENDATION:
Screening mammogram in one year. (Code:51-O-LD2)

BI-RADS CATEGORY  1: Negative.

## 2023-09-29 DIAGNOSIS — Z6831 Body mass index (BMI) 31.0-31.9, adult: Secondary | ICD-10-CM | POA: Diagnosis not present

## 2023-09-29 DIAGNOSIS — E119 Type 2 diabetes mellitus without complications: Secondary | ICD-10-CM | POA: Diagnosis not present

## 2023-09-29 DIAGNOSIS — I10 Essential (primary) hypertension: Secondary | ICD-10-CM | POA: Diagnosis not present

## 2023-09-29 DIAGNOSIS — E782 Mixed hyperlipidemia: Secondary | ICD-10-CM | POA: Diagnosis not present

## 2023-09-29 DIAGNOSIS — E66811 Obesity, class 1: Secondary | ICD-10-CM | POA: Diagnosis not present

## 2023-10-06 DIAGNOSIS — E782 Mixed hyperlipidemia: Secondary | ICD-10-CM | POA: Diagnosis not present

## 2023-10-06 DIAGNOSIS — E559 Vitamin D deficiency, unspecified: Secondary | ICD-10-CM | POA: Diagnosis not present

## 2023-10-06 DIAGNOSIS — E119 Type 2 diabetes mellitus without complications: Secondary | ICD-10-CM | POA: Diagnosis not present

## 2023-10-06 DIAGNOSIS — I10 Essential (primary) hypertension: Secondary | ICD-10-CM | POA: Diagnosis not present

## 2023-10-22 DIAGNOSIS — I77819 Aortic ectasia, unspecified site: Secondary | ICD-10-CM | POA: Diagnosis not present

## 2023-10-22 DIAGNOSIS — Q2381 Bicuspid aortic valve: Secondary | ICD-10-CM | POA: Diagnosis not present

## 2023-10-22 DIAGNOSIS — I350 Nonrheumatic aortic (valve) stenosis: Secondary | ICD-10-CM | POA: Diagnosis not present

## 2023-10-22 DIAGNOSIS — I2510 Atherosclerotic heart disease of native coronary artery without angina pectoris: Secondary | ICD-10-CM | POA: Diagnosis not present

## 2023-11-14 DIAGNOSIS — I2510 Atherosclerotic heart disease of native coronary artery without angina pectoris: Secondary | ICD-10-CM | POA: Diagnosis not present

## 2023-11-21 DIAGNOSIS — E119 Type 2 diabetes mellitus without complications: Secondary | ICD-10-CM | POA: Diagnosis not present

## 2023-11-21 DIAGNOSIS — I2510 Atherosclerotic heart disease of native coronary artery without angina pectoris: Secondary | ICD-10-CM | POA: Diagnosis not present

## 2023-11-21 DIAGNOSIS — E782 Mixed hyperlipidemia: Secondary | ICD-10-CM | POA: Diagnosis not present

## 2023-12-08 ENCOUNTER — Inpatient Hospital Stay: Payer: BC Managed Care – PPO | Attending: Oncology

## 2023-12-08 DIAGNOSIS — Z9884 Bariatric surgery status: Secondary | ICD-10-CM | POA: Insufficient documentation

## 2023-12-08 DIAGNOSIS — D509 Iron deficiency anemia, unspecified: Secondary | ICD-10-CM | POA: Diagnosis not present

## 2023-12-08 DIAGNOSIS — D508 Other iron deficiency anemias: Secondary | ICD-10-CM

## 2023-12-08 DIAGNOSIS — E538 Deficiency of other specified B group vitamins: Secondary | ICD-10-CM | POA: Insufficient documentation

## 2023-12-08 LAB — CBC WITH DIFFERENTIAL (CANCER CENTER ONLY)
Abs Immature Granulocytes: 0.01 10*3/uL (ref 0.00–0.07)
Basophils Absolute: 0.1 10*3/uL (ref 0.0–0.1)
Basophils Relative: 1 %
Eosinophils Absolute: 0.3 10*3/uL (ref 0.0–0.5)
Eosinophils Relative: 7 %
HCT: 36.3 % (ref 36.0–46.0)
Hemoglobin: 11.5 g/dL — ABNORMAL LOW (ref 12.0–15.0)
Immature Granulocytes: 0 %
Lymphocytes Relative: 29 %
Lymphs Abs: 1.3 10*3/uL (ref 0.7–4.0)
MCH: 24.9 pg — ABNORMAL LOW (ref 26.0–34.0)
MCHC: 31.7 g/dL (ref 30.0–36.0)
MCV: 78.7 fL — ABNORMAL LOW (ref 80.0–100.0)
Monocytes Absolute: 0.4 10*3/uL (ref 0.1–1.0)
Monocytes Relative: 9 %
Neutro Abs: 2.3 10*3/uL (ref 1.7–7.7)
Neutrophils Relative %: 54 %
Platelet Count: 305 10*3/uL (ref 150–400)
RBC: 4.61 MIL/uL (ref 3.87–5.11)
RDW: 15.3 % (ref 11.5–15.5)
WBC Count: 4.4 10*3/uL (ref 4.0–10.5)
nRBC: 0 % (ref 0.0–0.2)

## 2023-12-08 LAB — IRON AND TIBC
Iron: 30 ug/dL (ref 28–170)
Saturation Ratios: 7 % — ABNORMAL LOW (ref 10.4–31.8)
TIBC: 410 ug/dL (ref 250–450)
UIBC: 380 ug/dL

## 2023-12-08 LAB — FERRITIN: Ferritin: 11 ng/mL (ref 11–307)

## 2023-12-11 ENCOUNTER — Inpatient Hospital Stay (HOSPITAL_BASED_OUTPATIENT_CLINIC_OR_DEPARTMENT_OTHER): Payer: BC Managed Care – PPO | Admitting: Oncology

## 2023-12-11 ENCOUNTER — Inpatient Hospital Stay: Payer: BC Managed Care – PPO

## 2023-12-11 ENCOUNTER — Encounter: Payer: Self-pay | Admitting: Oncology

## 2023-12-11 VITALS — BP 119/84 | HR 79 | Temp 96.0°F | Resp 18 | Wt 182.4 lb

## 2023-12-11 VITALS — BP 117/70

## 2023-12-11 DIAGNOSIS — Z9884 Bariatric surgery status: Secondary | ICD-10-CM

## 2023-12-11 DIAGNOSIS — D509 Iron deficiency anemia, unspecified: Secondary | ICD-10-CM

## 2023-12-11 DIAGNOSIS — D508 Other iron deficiency anemias: Secondary | ICD-10-CM | POA: Diagnosis not present

## 2023-12-11 DIAGNOSIS — E538 Deficiency of other specified B group vitamins: Secondary | ICD-10-CM | POA: Diagnosis not present

## 2023-12-11 MED ORDER — IRON SUCROSE 20 MG/ML IV SOLN
200.0000 mg | INTRAVENOUS | Status: DC
  Administered 2023-12-11: 200 mg via INTRAVENOUS

## 2023-12-11 NOTE — Assessment & Plan Note (Signed)
 B12 level has improve  Continue B12 level every other day.  Monitor B12 and folate level annually.

## 2023-12-11 NOTE — Patient Instructions (Signed)

## 2023-12-11 NOTE — Assessment & Plan Note (Addendum)
 Labs reviewed and discussed with patient Lab Results  Component Value Date   HGB 11.5 (L) 12/08/2023   TIBC 410 12/08/2023   IRONPCTSAT 7 (L) 12/08/2023   FERRITIN 11 12/08/2023     With her history of gastric bypass and heavy menstrual period, recommend Venofer  weekly x 3 to further improve iron  stores.. Patient denies any chance of pregnancy and declines pregnancy testing prior to Venofer  treatments. 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  She is 48 years old and is due for colonoscopy anyway.  She agrees.  Referred to GI

## 2023-12-11 NOTE — Assessment & Plan Note (Signed)
IV Venofer maintenance.  Continue monitor B12 and folate periodically.

## 2023-12-11 NOTE — Progress Notes (Signed)
 Hematology/Oncology Progress note [phone] [phone]     Abraham Abo, MD  ASSESSMENT & PLAN:   Iron  deficiency anemia Labs reviewed and discussed with patient Lab Results  Component Value Date   HGB 11.5 (L) 12/08/2023   TIBC 410 12/08/2023   IRONPCTSAT 7 (L) 12/08/2023   FERRITIN 11 12/08/2023     With her history of gastric bypass and heavy menstrual period, recommend Venofer  weekly x 3 to further improve iron  stores.. Patient denies any chance of pregnancy and declines pregnancy testing prior to Venofer  treatments. 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  She is 48 years old and is due for colonoscopy anyway.  She agrees.  Referred to GI    History of gastric bypass IV Venofer  maintenance.  Continue monitor B12 and folate periodically.     B12 deficiency B12 level has improve  Continue B12 level every other day.  Monitor B12 and folate level annually.    Orders Placed This Encounter  Procedures   CBC with Differential (Cancer Center Only)    Standing Status:   Future    Expected Date:   06/11/2024    Expiration Date:   09/09/2024   Ferritin    Standing Status:   Future    Expected Date:   06/11/2024    Expiration Date:   09/09/2024   Iron  and TIBC    Standing Status:   Future    Expected Date:   06/11/2024    Expiration Date:   09/09/2024   Vitamin B12    Standing Status:   Future    Expected Date:   06/11/2024    Expiration Date:   09/09/2024   Folate    Standing Status:   Future    Expected Date:   06/11/2024    Expiration Date:   09/09/2024   Follow-up in 6 months, labs prior to MD +/- Venofer . All questions were answered. The patient knows to call the clinic with any problems, questions or concerns. No barriers to learning was detected.  Timmy Forbes, MD, PhD [facility] Hematology Oncology 12/11/2023     PERTINENT HEMATOLOGIC HISTORY: Patient previously followed up by Dr.Corcoran, patient switched care to me on 12/04/21 Patient has a history of gastric bypass surgery in 2008, chronic iron  deficiency anemia, previously treated with multiple doses of IV Feraheme .  Recently due to change of insurance coverage, switched to IV Venofer  treatments and the tolerated well  Patient continues to have menorrhagia.  Reports feeling tired and fatigued. She takes oral iron  supplementation daily.  Denies any black or bloody stool. I have reviewed the past medical history, past surgical history, social history and family history with the patient and they are unchanged from previous note.  INTERVAL HISTORY: Jeanette Flores 48 y.o. female presents for follow up of iron  deficiency anemia, history of gastric bypass She reports feeling ok.  Menstrual period remains heavy.  ALLERGIES:  is allergic to azithromycin  and penicillins.  MEDICATIONS:  Current Outpatient Medications  Medication Sig Dispense Refill   aspirin  81 MG tablet Take 1 tablet (81 mg total) by mouth daily. 30 tablet 11   atorvastatin  (LIPITOR) 20 MG tablet Take 1 tablet (20 mg total) by mouth daily. Solum 90 tablet 1   ferrous sulfate 325 (65 FE) MG tablet Take 325 mg by mouth daily with breakfast. otc     fluticasone  (FLONASE ) 50  MCG/ACT nasal spray SHAKE LIQUID AND USE 2 SPRAYS IN EACH NOSTRIL DAILY 16 g 5   metFORMIN  (GLUCOPHAGE  XR) 500 MG 24 hr tablet Take 1 tablet (500 mg total) by mouth daily with breakfast. 90 tablet 1   tirzepatide (MOUNJARO) 12.5 MG/0.5ML Pen Inject 12.5 mg into the skin once a week.     triamcinolone  cream (KENALOG ) 0.1 % Apply 1 Application topically 2 (two) times daily. 453.6 g 0   valsartan  (DIOVAN ) 80 MG tablet Take 1 tablet (80 mg total) by mouth daily. 90 tablet 1   ergocalciferol (VITAMIN D2) 1.25 MG (50000 UT) capsule TAKE 1 CAPSULE BY MOUTH 1 TIME A WEEK (Patient not taking: Reported on 12/11/2023)     No  current facility-administered medications for this visit.   Facility-Administered Medications Ordered in Other Visits  Medication Dose Route Frequency Provider Last Rate Last Admin   iron  sucrose (VENOFER ) injection 200 mg  200 mg Intravenous Weekly Rao, Archana C, MD   200 mg at 12/11/23 1115     Review of Systems  Constitutional:  Positive for fatigue. Negative for appetite change, chills and fever.  HENT:   Negative for hearing loss and voice change.   Eyes:  Negative for eye problems.  Respiratory:  Negative for chest tightness and cough.   Cardiovascular:  Negative for chest pain.  Gastrointestinal:  Negative for abdominal distention, abdominal pain and blood in stool.  Endocrine: Negative for hot flashes.  Genitourinary:  Positive for menstrual problem. Negative for difficulty urinating and frequency.   Musculoskeletal:  Negative for arthralgias.  Skin:  Negative for itching and rash.  Neurological:  Negative for extremity weakness.  Hematological:  Negative for adenopathy.  Psychiatric/Behavioral:  Negative for confusion.      PHYSICAL EXAMINATION: ECOG PERFORMANCE STATUS: 1 - Symptomatic but completely ambulatory  Vitals:   12/11/23 1037  BP: 119/84  Pulse: 79  Resp: 18  Temp: (!) 96 F (35.6 C)  SpO2: 100%   Filed Weights   12/11/23 1037  Weight: 182 lb 6.4 oz (82.7 kg)    Physical Exam Constitutional:      General: She is not in acute distress.    Appearance: She is obese. She is not diaphoretic.  HENT:     Head: Normocephalic and atraumatic.   Eyes:     General: No scleral icterus.    Pupils: Pupils are equal, round, and reactive to light.    Cardiovascular:     Rate and Rhythm: Normal rate and regular rhythm.     Heart sounds: Murmur heard.  Pulmonary:     Effort: Pulmonary effort is normal. No respiratory distress.     Breath sounds: No wheezing.  Abdominal:     General: There is no distension.   Musculoskeletal:        General: Normal range  of motion.     Cervical back: Normal range of motion and neck supple.   Skin:    General: Skin is dry.     Findings: No erythema.   Neurological:     Mental Status: She is alert and oriented to person, place, and time. Mental status is at baseline.     Cranial Nerves: No cranial nerve deficit.     Motor: No abnormal muscle tone.   Psychiatric:        Mood and Affect: Mood and affect normal.      LABORATORY DATA:  I have reviewed the data as listed    Latest Ref Rng &  Units 12/08/2023    8:24 AM 06/09/2023    8:11 AM 12/10/2022   10:49 AM  CBC  WBC 4.0 - 10.5 K/uL 4.4  4.9  5.9   Hemoglobin 12.0 - 15.0 g/dL 16.1  09.6  04.5   Hematocrit 36.0 - 46.0 % 36.3  35.4  37.5   Platelets 150 - 400 K/uL 305  293  345       Latest Ref Rng & Units 08/18/2023   10:48 AM 12/10/2022   10:49 AM 09/19/2022   12:00 AM  CMP  Glucose 70 - 99 mg/dL 77  85    BUN 6 - 24 mg/dL 15  17  25       Creatinine 0.57 - 1.00 mg/dL 4.09  8.11  1.1      Sodium 134 - 144 mmol/L 139  136  145      Potassium 3.5 - 5.2 mmol/L 4.9  4.4  5.1      Chloride 96 - 106 mmol/L 99  102  109      CO2 20 - 29 mmol/L 23  26  32      Calcium  8.7 - 10.2 mg/dL 9.5  8.9    Total Protein 6.0 - 8.5 g/dL 6.7  7.3    Total Bilirubin 0.0 - 1.2 mg/dL 0.4  0.6    Alkaline Phos 44 - 121 IU/L 70  63  104      AST 0 - 40 IU/L 18  16  39      ALT 0 - 32 IU/L 17  15  38         This result is from an external source.   Iron /TIBC/Ferritin/ %Sat    Component Value Date/Time   IRON  30 12/08/2023 0824   IRON  129 08/23/2013 0827   TIBC 410 12/08/2023 0824   TIBC 432 08/23/2013 0827   FERRITIN 11 12/08/2023 0824   FERRITIN 25 08/23/2013 0827   IRONPCTSAT 7 (L) 12/08/2023 0824   IRONPCTSAT 30 08/23/2013 0827       RADIOGRAPHIC STUDIES: I have personally reviewed the radiological images as listed and agreed with the findings in the report. No results found.

## 2023-12-12 ENCOUNTER — Telehealth: Payer: Self-pay

## 2023-12-12 NOTE — Telephone Encounter (Signed)
 GI referral faxed to [facility] (Dr. Corky Diener) Re: IDA, colonoscopy screening.  Fax confirmation received.

## 2023-12-18 ENCOUNTER — Inpatient Hospital Stay

## 2023-12-19 ENCOUNTER — Inpatient Hospital Stay

## 2023-12-19 VITALS — BP 96/72 | HR 100 | Temp 97.6°F | Resp 18

## 2023-12-19 DIAGNOSIS — Z9884 Bariatric surgery status: Secondary | ICD-10-CM | POA: Diagnosis not present

## 2023-12-19 DIAGNOSIS — D509 Iron deficiency anemia, unspecified: Secondary | ICD-10-CM | POA: Diagnosis not present

## 2023-12-19 DIAGNOSIS — E538 Deficiency of other specified B group vitamins: Secondary | ICD-10-CM | POA: Diagnosis not present

## 2023-12-19 MED ORDER — IRON SUCROSE 20 MG/ML IV SOLN
200.0000 mg | INTRAVENOUS | Status: DC
  Administered 2023-12-19: 200 mg via INTRAVENOUS
  Filled 2023-12-19: qty 10

## 2023-12-19 NOTE — Patient Instructions (Signed)

## 2023-12-24 ENCOUNTER — Inpatient Hospital Stay: Attending: Oncology

## 2023-12-24 VITALS — BP 107/77 | HR 87 | Temp 98.5°F | Resp 16

## 2023-12-24 DIAGNOSIS — D509 Iron deficiency anemia, unspecified: Secondary | ICD-10-CM | POA: Diagnosis not present

## 2023-12-24 MED ORDER — IRON SUCROSE 20 MG/ML IV SOLN
200.0000 mg | INTRAVENOUS | Status: DC
  Administered 2023-12-24: 200 mg via INTRAVENOUS

## 2023-12-24 NOTE — Patient Instructions (Signed)

## 2023-12-24 NOTE — Progress Notes (Signed)
 Declined post-observation. Aware of risks. Vitals stable at discharge.

## 2024-01-16 ENCOUNTER — Ambulatory Visit: Payer: BC Managed Care – PPO | Admitting: Family Medicine

## 2024-01-16 VITALS — BP 107/72 | HR 91 | Ht 64.0 in | Wt 180.8 lb

## 2024-01-16 DIAGNOSIS — E782 Mixed hyperlipidemia: Secondary | ICD-10-CM | POA: Diagnosis not present

## 2024-01-16 DIAGNOSIS — I10 Essential (primary) hypertension: Secondary | ICD-10-CM

## 2024-01-16 DIAGNOSIS — I152 Hypertension secondary to endocrine disorders: Secondary | ICD-10-CM

## 2024-01-16 DIAGNOSIS — E119 Type 2 diabetes mellitus without complications: Secondary | ICD-10-CM

## 2024-01-16 DIAGNOSIS — E785 Hyperlipidemia, unspecified: Secondary | ICD-10-CM

## 2024-01-16 MED ORDER — VALSARTAN 80 MG PO TABS: 80.0000 mg | ORAL_TABLET | Freq: Every day | ORAL | 1 refills | Status: DC

## 2024-01-16 MED ORDER — ATORVASTATIN CALCIUM 20 MG PO TABS: 20.0000 mg | ORAL_TABLET | Freq: Every day | ORAL | 1 refills | Status: AC

## 2024-01-16 NOTE — Progress Notes (Signed)
 Established Patient Office Visit  Subjective    Patient ID: Jeanette Flores, female    DOB: [birth year]  Age: 48 y.o. MRN: [id number]  CC:  Chief Complaint  Patient presents with   Medical Management of Chronic Issues    HPI Jeanette Flores presents for routine follow up of chronic med issues including diabetes and hypertension. Patient reports med compliance and denies acute complaints.   Outpatient Encounter Medications as of 01/16/2024  Medication Sig   aspirin  81 MG tablet Take 1 tablet (81 mg total) by mouth daily.   ergocalciferol (VITAMIN D2) 1.25 MG (50000 UT) capsule TAKE 1 CAPSULE BY MOUTH 1 TIME A WEEK   ferrous sulfate 325 (65 FE) MG tablet Take 325 mg by mouth daily with breakfast. otc   fluticasone  (FLONASE ) 50 MCG/ACT nasal spray SHAKE LIQUID AND USE 2 SPRAYS IN EACH NOSTRIL DAILY   metFORMIN  (GLUCOPHAGE  XR) 500 MG 24 hr tablet Take 1 tablet (500 mg total) by mouth daily with breakfast.   tirzepatide (MOUNJARO) 12.5 MG/0.5ML Pen Inject 12.5 mg into the skin once a week.   triamcinolone  cream (KENALOG ) 0.1 % Apply 1 Application topically 2 (two) times daily.   [DISCONTINUED] atorvastatin  (LIPITOR) 20 MG tablet Take 1 tablet (20 mg total) by mouth daily. Solum   [DISCONTINUED] valsartan  (DIOVAN ) 80 MG tablet Take 1 tablet (80 mg total) by mouth daily.   atorvastatin  (LIPITOR) 20 MG tablet Take 1 tablet (20 mg total) by mouth daily. Solum   valsartan  (DIOVAN ) 80 MG tablet Take 1 tablet (80 mg total) by mouth daily.   No facility-administered encounter medications on file as of 01/16/2024.    Past Medical History:  Diagnosis Date   Anemia    Hypertension     Past Surgical History:  Procedure Laterality Date   Gastric bypass surgery      Family History  Problem Relation Age of Onset   Diabetes Father    Breast cancer Maternal Aunt    Diabetes Paternal Grandmother     Social History   Socioeconomic History   Marital status: Married    Spouse name: Not  on file   Number of children: Not on file   Years of education: Not on file   Highest education level: Bachelor's degree (e.g., BA, AB, BS)  Occupational History   Not on file  Tobacco Use   Smoking status: Never   Smokeless tobacco: Never  Substance and Sexual Activity   Alcohol use: No    Alcohol/week: 0.0 standard drinks of alcohol   Drug use: No   Sexual activity: Yes  Other Topics Concern   Not on file  Social History Narrative   Not on file   Social Drivers of Health   Financial Resource Strain: High Risk (01/15/2024)   Overall Financial Resource Strain (CARDIA)    Difficulty of Paying Living Expenses: Hard  Food Insecurity: No Food Insecurity (01/15/2024)   Hunger Vital Sign    Worried About Running Out of Food in the Last Year: Never true    Ran Out of Food in the Last Year: Never true  Transportation Needs: No Transportation Needs (01/15/2024)   PRAPARE - Administrator, Civil Service (Medical): No    Lack of Transportation (Non-Medical): No  Physical Activity: Sufficiently Active (01/15/2024)   Exercise Vital Sign    Days of Exercise per Week: 6 days    Minutes of Exercise per Session: 40 min  Stress: No Stress Concern Present (01/15/2024)  [employer] of Occupational Health - Occupational Stress Questionnaire    Feeling of Stress: Not at all  Social Connections: Socially Integrated (01/15/2024)   Social Connection and Isolation Panel    Frequency of Communication with Friends and Family: More than three times a week    Frequency of Social Gatherings with Friends and Family: Once a week    Attends Religious Services: More than 4 times per year    Active Member of [employer] or Organizations: Yes    Attends Engineer, structural: More than 4 times per year    Marital Status: Married  Catering manager Violence: Not on file    Review of Systems  All other systems reviewed and are negative.       Objective    BP 107/72   Pulse 91   Ht  5' 4 (1.626 m)   Wt 180 lb 12.8 oz (82 kg)   LMP 01/02/2024 (Approximate)   SpO2 98%   BMI 31.03 kg/m   Physical Exam Vitals and nursing note reviewed.  Constitutional:      General: She is not in acute distress. Cardiovascular:     Rate and Rhythm: Normal rate and regular rhythm.  Pulmonary:     Effort: Pulmonary effort is normal.     Breath sounds: Normal breath sounds.  Abdominal:     Palpations: Abdomen is soft.     Tenderness: There is no abdominal tenderness.  Neurological:     General: No focal deficit present.     Mental Status: She is alert and oriented to person, place, and time.         Assessment & Plan:   1. Type 2 diabetes mellitus without complication, without long-term current use of insulin (HCC) (Primary) Most recent A1c wnl. Continue   2. Essential hypertension Appears stable. Continue. Meds refilled.   3. Mixed hyperlipidemia Continue   - atorvastatin  (LIPITOR) 20 MG tablet; Take 1 tablet (20 mg total) by mouth daily. Solum  Dispense: 90 tablet; Refill: 1      Return in about 6 months (around 07/18/2024) for physical.   Tanda Raguel SQUIBB, MD

## 2024-01-19 ENCOUNTER — Encounter: Payer: Self-pay | Admitting: Family Medicine

## 2024-02-10 DIAGNOSIS — Z1152 Encounter for screening for COVID-19: Secondary | ICD-10-CM | POA: Diagnosis not present

## 2024-02-10 DIAGNOSIS — I10 Essential (primary) hypertension: Secondary | ICD-10-CM | POA: Diagnosis not present

## 2024-02-10 DIAGNOSIS — J029 Acute pharyngitis, unspecified: Secondary | ICD-10-CM | POA: Diagnosis not present

## 2024-02-10 DIAGNOSIS — R051 Acute cough: Secondary | ICD-10-CM | POA: Diagnosis not present

## 2024-02-10 DIAGNOSIS — J00 Acute nasopharyngitis [common cold]: Secondary | ICD-10-CM | POA: Diagnosis not present

## 2024-04-05 DIAGNOSIS — E119 Type 2 diabetes mellitus without complications: Secondary | ICD-10-CM | POA: Diagnosis not present

## 2024-04-05 DIAGNOSIS — E559 Vitamin D deficiency, unspecified: Secondary | ICD-10-CM | POA: Diagnosis not present

## 2024-04-05 DIAGNOSIS — E782 Mixed hyperlipidemia: Secondary | ICD-10-CM | POA: Diagnosis not present

## 2024-04-12 DIAGNOSIS — I10 Essential (primary) hypertension: Secondary | ICD-10-CM | POA: Diagnosis not present

## 2024-04-12 DIAGNOSIS — Z1331 Encounter for screening for depression: Secondary | ICD-10-CM | POA: Diagnosis not present

## 2024-04-12 DIAGNOSIS — E119 Type 2 diabetes mellitus without complications: Secondary | ICD-10-CM | POA: Diagnosis not present

## 2024-04-15 NOTE — Progress Notes (Signed)
 KEALA DRUM                                          MRN: [id number]   04/15/2024   The [facility] Quality Team Specialist reviewed this patient medical record for the purposes of chart review for care gap closure. The following were reviewed: abstraction for care gap closure-glycemic status assessment.    VBCI Quality Team

## 2024-04-26 ENCOUNTER — Encounter: Payer: Self-pay | Admitting: Oncology

## 2024-04-26 DIAGNOSIS — D509 Iron deficiency anemia, unspecified: Secondary | ICD-10-CM | POA: Diagnosis not present

## 2024-04-26 NOTE — Telephone Encounter (Signed)
 Pt had cbc today at [facility] clinic

## 2024-05-10 ENCOUNTER — Encounter: Payer: Self-pay | Admitting: Oncology

## 2024-05-10 ENCOUNTER — Ambulatory Visit: Payer: Self-pay

## 2024-05-10 DIAGNOSIS — K640 First degree hemorrhoids: Secondary | ICD-10-CM | POA: Diagnosis not present

## 2024-05-10 DIAGNOSIS — D250 Submucous leiomyoma of uterus: Secondary | ICD-10-CM | POA: Diagnosis not present

## 2024-05-10 DIAGNOSIS — K635 Polyp of colon: Secondary | ICD-10-CM | POA: Diagnosis not present

## 2024-05-10 DIAGNOSIS — K222 Esophageal obstruction: Secondary | ICD-10-CM | POA: Diagnosis not present

## 2024-05-10 DIAGNOSIS — Z9884 Bariatric surgery status: Secondary | ICD-10-CM | POA: Diagnosis not present

## 2024-05-12 ENCOUNTER — Encounter: Payer: Self-pay | Admitting: Oncology

## 2024-05-12 ENCOUNTER — Inpatient Hospital Stay

## 2024-05-12 ENCOUNTER — Inpatient Hospital Stay: Attending: Oncology | Admitting: Oncology

## 2024-05-12 VITALS — BP 125/79 | HR 72

## 2024-05-12 VITALS — BP 117/85 | HR 86 | Temp 98.2°F | Resp 16 | Wt 174.0 lb

## 2024-05-12 DIAGNOSIS — D509 Iron deficiency anemia, unspecified: Secondary | ICD-10-CM | POA: Insufficient documentation

## 2024-05-12 DIAGNOSIS — Z9884 Bariatric surgery status: Secondary | ICD-10-CM | POA: Insufficient documentation

## 2024-05-12 DIAGNOSIS — D508 Other iron deficiency anemias: Secondary | ICD-10-CM | POA: Diagnosis not present

## 2024-05-12 DIAGNOSIS — E538 Deficiency of other specified B group vitamins: Secondary | ICD-10-CM | POA: Insufficient documentation

## 2024-05-12 MED ORDER — IRON SUCROSE 20 MG/ML IV SOLN
200.0000 mg | INTRAVENOUS | Status: DC
  Administered 2024-05-12: 200 mg via INTRAVENOUS
  Filled 2024-05-12: qty 10

## 2024-05-12 NOTE — Patient Instructions (Signed)

## 2024-05-12 NOTE — Progress Notes (Signed)
 Pt in for follow up, reports she does not feel like iron  infusions are lasting as long. Pt reports increased fatigue.

## 2024-05-12 NOTE — Assessment & Plan Note (Addendum)
 Continue B12 level every other day.  Monitor B12 and folate level annually.

## 2024-05-12 NOTE — Assessment & Plan Note (Signed)
IV Venofer maintenance.  Continue monitor B12 and folate periodically.

## 2024-05-12 NOTE — Progress Notes (Signed)
 Hematology/Oncology Progress note [phone] [phone]     Jeanette Bleacher, MD  ASSESSMENT & PLAN:   Iron  deficiency anemia Labs reviewed and discussed with patient Lab Results  Component Value Date   HGB 11.5 (L) 12/08/2023   TIBC 410 12/08/2023   IRONPCTSAT 7 (L) 12/08/2023   FERRITIN 11 12/08/2023   She had blood work done at [facility] Hb is decreased, iron  panel stable.  With her history of gastric bypass and heavy menstrual period, recommend Venofer  weekly x 2 to further improve iron  stores.. Patient denies any chance of pregnancy   She has had EGD and colonoscopy done in Nov 2025. Results are not available in EMR    B12 deficiency Continue B12 level every other day.  Monitor B12 and folate level annually.  History of gastric bypass IV Venofer  maintenance.  Continue monitor B12 and folate periodically.       Orders Placed This Encounter  Procedures   Ferritin    Standing Status:   Future    Expected Date:   09/09/2024    Expiration Date:   12/08/2024   CBC with Differential (Cancer Center Only)    Standing Status:   Future    Expected Date:   09/09/2024    Expiration Date:   12/08/2024   Iron  and TIBC    Standing Status:   Future    Expected Date:   09/09/2024    Expiration Date:   12/08/2024   Vitamin B12    Standing Status:   Future    Expected Date:   09/09/2024    Expiration Date:   12/08/2024   Folate    Standing Status:   Future    Expected Date:   09/09/2024    Expiration Date:   12/08/2024   Follow-up in 4 months, labs prior to MD +/- Venofer . All questions were answered. The patient knows to call the clinic with any problems, questions or concerns. No barriers to learning was detected.  Zelphia Cap, MD, PhD [facility] Hematology Oncology 05/12/2024    PERTINENT HEMATOLOGIC HISTORY: Patient previously followed up by Dr.Corcoran, patient switched care to me on 12/04/21 Patient has a history of gastric bypass surgery in  2008, chronic iron  deficiency anemia, previously treated with multiple doses of IV Feraheme .  Recently due to change of insurance coverage, switched to IV Venofer  treatments and the tolerated well  Patient continues to have menorrhagia.  Reports feeling tired and fatigued. She takes oral iron  supplementation daily.  Denies any black or bloody stool. I have reviewed the past medical history, past surgical history, social history and family history with the patient and they are unchanged from previous note.  INTERVAL HISTORY: Jeanette Flores 48 y.o. female presents for follow up of iron  deficiency anemia, history of gastric bypass She reports feeling ok.  Menstrual period remains heavy. Just had EGD and colonoscopy. A polyp was removed and pathology is pending.   ALLERGIES:  is allergic to azithromycin  and penicillins.  MEDICATIONS:  Current Outpatient Medications  Medication Sig Dispense Refill   aspirin  81 MG tablet Take 1 tablet (81 mg total) by mouth daily. 30 tablet 11   atorvastatin  (LIPITOR) 20 MG tablet Take 1 tablet (20 mg total) by mouth daily. Solum 90 tablet 1   ergocalciferol (VITAMIN D2) 1.25 MG (50000 UT) capsule TAKE 1 CAPSULE BY MOUTH 1 TIME A WEEK     ferrous sulfate 325 (65 FE) MG tablet Take 325 mg by mouth daily with breakfast.  otc     fluticasone  (FLONASE ) 50 MCG/ACT nasal spray SHAKE LIQUID AND USE 2 SPRAYS IN EACH NOSTRIL DAILY 16 g 5   metFORMIN  (GLUCOPHAGE  XR) 500 MG 24 hr tablet Take 1 tablet (500 mg total) by mouth daily with breakfast. 90 tablet 1   tirzepatide (MOUNJARO) 12.5 MG/0.5ML Pen Inject 12.5 mg into the skin once a week.     triamcinolone  cream (KENALOG ) 0.1 % Apply 1 Application topically 2 (two) times daily. 453.6 g 0   valsartan  (DIOVAN ) 80 MG tablet Take 1 tablet (80 mg total) by mouth daily. 90 tablet 1   No current facility-administered medications for this visit.   Facility-Administered Medications Ordered in Other Visits  Medication Dose  Route Frequency Provider Last Rate Last Admin   iron  sucrose (VENOFER ) injection 200 mg  200 mg Intravenous Weekly Rao, Archana C, MD   200 mg at 05/12/24 1210     Review of Systems  Constitutional:  Positive for fatigue. Negative for appetite change, chills and fever.  HENT:   Negative for hearing loss and voice change.   Eyes:  Negative for eye problems.  Respiratory:  Negative for chest tightness and cough.   Cardiovascular:  Negative for chest pain.  Gastrointestinal:  Negative for abdominal distention, abdominal pain and blood in stool.  Endocrine: Negative for hot flashes.  Genitourinary:  Positive for menstrual problem. Negative for difficulty urinating and frequency.   Musculoskeletal:  Negative for arthralgias.  Skin:  Negative for itching and rash.  Neurological:  Negative for extremity weakness.  Hematological:  Negative for adenopathy.  Psychiatric/Behavioral:  Negative for confusion.      PHYSICAL EXAMINATION: ECOG PERFORMANCE STATUS: 1 - Symptomatic but completely ambulatory  Vitals:   05/12/24 1101  BP: 117/85  Pulse: 86  Resp: 16  Temp: 98.2 F (36.8 C)  SpO2: 100%   Filed Weights   05/12/24 1101  Weight: 174 lb (78.9 kg)    Physical Exam Constitutional:      General: She is not in acute distress.    Appearance: She is obese. She is not diaphoretic.  HENT:     Head: Normocephalic and atraumatic.  Eyes:     General: No scleral icterus.    Pupils: Pupils are equal, round, and reactive to light.  Cardiovascular:     Rate and Rhythm: Normal rate and regular rhythm.     Heart sounds: Murmur heard.  Pulmonary:     Effort: Pulmonary effort is normal. No respiratory distress.     Breath sounds: No wheezing.  Abdominal:     General: There is no distension.  Musculoskeletal:        General: Normal range of motion.     Cervical back: Normal range of motion and neck supple.  Skin:    General: Skin is dry.     Findings: No erythema.  Neurological:      Mental Status: She is alert and oriented to person, place, and time. Mental status is at baseline.     Cranial Nerves: No cranial nerve deficit.     Motor: No abnormal muscle tone.  Psychiatric:        Mood and Affect: Mood and affect normal.      LABORATORY DATA:  I have reviewed the data as listed    Latest Ref Rng & Units 12/08/2023    8:24 AM 06/09/2023    8:11 AM 12/10/2022   10:49 AM  CBC  WBC 4.0 - 10.5 K/uL 4.4  4.9  5.9   Hemoglobin 12.0 - 15.0 g/dL 88.4  88.8  88.2   Hematocrit 36.0 - 46.0 % 36.3  35.4  37.5   Platelets 150 - 400 K/uL 305  293  345       Latest Ref Rng & Units 08/18/2023   10:48 AM 12/10/2022   10:49 AM 09/19/2022   12:00 AM  CMP  Glucose 70 - 99 mg/dL 77  85    BUN 6 - 24 mg/dL 15  17  25       Creatinine 0.57 - 1.00 mg/dL 9.09  9.23  1.1      Sodium 134 - 144 mmol/L 139  136  145      Potassium 3.5 - 5.2 mmol/L 4.9  4.4  5.1      Chloride 96 - 106 mmol/L 99  102  109      CO2 20 - 29 mmol/L 23  26  32      Calcium  8.7 - 10.2 mg/dL 9.5  8.9    Total Protein 6.0 - 8.5 g/dL 6.7  7.3    Total Bilirubin 0.0 - 1.2 mg/dL 0.4  0.6    Alkaline Phos 44 - 121 IU/L 70  63  104      AST 0 - 40 IU/L 18  16  39      ALT 0 - 32 IU/L 17  15  38         This result is from an external source.   Iron /TIBC/Ferritin/ %Sat    Component Value Date/Time   IRON  30 12/08/2023 0824   IRON  129 08/23/2013 0827   TIBC 410 12/08/2023 0824   TIBC 432 08/23/2013 0827   FERRITIN 11 12/08/2023 0824   FERRITIN 25 08/23/2013 0827   IRONPCTSAT 7 (L) 12/08/2023 0824   IRONPCTSAT 30 08/23/2013 0827       RADIOGRAPHIC STUDIES: I have personally reviewed the radiological images as listed and agreed with the findings in the report. No results found.

## 2024-05-12 NOTE — Assessment & Plan Note (Addendum)
 Labs reviewed and discussed with patient Lab Results  Component Value Date   HGB 11.5 (L) 12/08/2023   TIBC 410 12/08/2023   IRONPCTSAT 7 (L) 12/08/2023   FERRITIN 11 12/08/2023   She had blood work done at [facility] Hb is decreased, iron  panel stable.  With her history of gastric bypass and heavy menstrual period, recommend Venofer  weekly x 2 to further improve iron  stores.. Patient denies any chance of pregnancy   She has had EGD and colonoscopy done in Nov 2025. Results are not available in EMR

## 2024-05-19 NOTE — Progress Notes (Signed)
 Jeanette Flores                                          MRN: [id number]   05/19/2024   The VBCI Quality Team Specialist reviewed this patient medical record for the purposes of chart review for care gap closure. The following were reviewed: chart review for care gap closure-kidney health evaluation for diabetes:eGFR  and uACR.    VBCI Quality Team

## 2024-05-24 ENCOUNTER — Encounter: Payer: Self-pay | Admitting: Oncology

## 2024-05-31 ENCOUNTER — Inpatient Hospital Stay: Attending: Oncology

## 2024-05-31 ENCOUNTER — Other Ambulatory Visit: Payer: Self-pay | Admitting: Oncology

## 2024-05-31 VITALS — BP 107/78 | HR 90 | Temp 99.1°F | Resp 14

## 2024-05-31 DIAGNOSIS — D509 Iron deficiency anemia, unspecified: Secondary | ICD-10-CM | POA: Insufficient documentation

## 2024-05-31 MED ORDER — IRON SUCROSE 20 MG/ML IV SOLN
200.0000 mg | INTRAVENOUS | Status: DC
  Administered 2024-05-31: 200 mg via INTRAVENOUS
  Filled 2024-05-31: qty 10

## 2024-05-31 NOTE — Patient Instructions (Signed)

## 2024-06-08 ENCOUNTER — Other Ambulatory Visit

## 2024-06-09 ENCOUNTER — Other Ambulatory Visit: Payer: Self-pay | Admitting: Family Medicine

## 2024-06-09 DIAGNOSIS — E1159 Type 2 diabetes mellitus with other circulatory complications: Secondary | ICD-10-CM

## 2024-06-11 ENCOUNTER — Ambulatory Visit

## 2024-06-11 ENCOUNTER — Ambulatory Visit: Admitting: Oncology

## 2024-07-19 ENCOUNTER — Encounter: Admitting: Family Medicine

## 2024-08-30 ENCOUNTER — Encounter: Admitting: Family Medicine

## 2024-09-14 ENCOUNTER — Inpatient Hospital Stay

## 2024-09-17 ENCOUNTER — Inpatient Hospital Stay: Admitting: Oncology

## 2024-09-17 ENCOUNTER — Inpatient Hospital Stay
